# Patient Record
Sex: Female | Born: 1965 | Race: White | Hispanic: No | Marital: Married | Smoking: Never smoker
Health system: Southern US, Community
[De-identification: ages and names within clinical notes are randomized; demographics above are authoritative.]

## PROBLEM LIST (undated history)

## (undated) DIAGNOSIS — I491 Atrial premature depolarization: Secondary | ICD-10-CM

## (undated) DIAGNOSIS — R002 Palpitations: Secondary | ICD-10-CM

## (undated) DIAGNOSIS — I493 Ventricular premature depolarization: Secondary | ICD-10-CM

## (undated) DIAGNOSIS — C4490 Unspecified malignant neoplasm of skin, unspecified: Secondary | ICD-10-CM

## (undated) HISTORY — PX: KNEE SURGERY: SHX244

## (undated) HISTORY — DX: Unspecified malignant neoplasm of skin, unspecified: C44.90

## (undated) HISTORY — DX: Palpitations: R00.2

## (undated) HISTORY — DX: Ventricular premature depolarization: I49.3

## (undated) HISTORY — PX: SKIN CANCER EXCISION: SHX779

## (undated) HISTORY — DX: Atrial premature depolarization: I49.1

---

## 2002-08-01 ENCOUNTER — Ambulatory Visit (HOSPITAL_BASED_OUTPATIENT_CLINIC_OR_DEPARTMENT_OTHER): Admission: RE | Admit: 2002-08-01 | Discharge: 2002-08-01 | Payer: Self-pay | Admitting: Plastic Surgery

## 2002-08-01 ENCOUNTER — Encounter (INDEPENDENT_AMBULATORY_CARE_PROVIDER_SITE_OTHER): Payer: Self-pay | Admitting: Specialist

## 2005-08-31 ENCOUNTER — Encounter: Admission: RE | Admit: 2005-08-31 | Discharge: 2005-08-31 | Payer: Self-pay | Admitting: Family Medicine

## 2005-10-18 ENCOUNTER — Ambulatory Visit: Payer: Self-pay

## 2010-01-05 ENCOUNTER — Encounter: Admission: RE | Admit: 2010-01-05 | Discharge: 2010-01-05 | Payer: Self-pay | Admitting: Obstetrics and Gynecology

## 2010-09-17 NOTE — Op Note (Signed)
NAME:  Kathryn Day, Kathryn Day                         ACCOUNT NO.:  [id number]   MEDICAL RECORD NO.:  [id number]                   PATIENT TYPE:  AMB   LOCATION:  DSC                                  FACILITY:  [facility]   PHYSICIAN:  Alfredia Ferguson, M.D.               DATE OF BIRTH:  [birth year]   DATE OF PROCEDURE:  08/01/2002  DATE OF DISCHARGE:                                 OPERATIVE REPORT   PREOPERATIVE DIAGNOSIS:  A 7 mm biopsy proven basal cell carcinoma, right  upper forehead.   POSTOPERATIVE DIAGNOSIS:  A 7 mm biopsy proven basal cell carcinoma, right  upper forehead.   OPERATION PERFORMED:  Elliptical excision of basal cell carcinoma with 2 mm  margins.   SURGEON:  Alfredia Ferguson, M.D.   ANESTHESIA:  2% Xylocaine 1:100,000 epinephrine.   INDICATIONS FOR PROCEDURE:  The patient is a 45 year old woman with a  previous history of basal cell carcinomas.  She has a new lesion right at  the hair line on her forehead.  She wishes to have it removed.  She  understands that she will be trading what she has for a permanent and  potentially unsightly scar.  She understands the risks of positive margins  which would necessitate secondary surgery.  In spite of these risks, the  patient wishes to proceed with the surgery.   DESCRIPTION OF PROCEDURE:  A small amount of hair was removed near the basal  cell carcinoma.  An elliptical skin marker was placed with approximately 2  to 3 mm margins around the lesion.  Local anesthesia using 2% Xylocaine  1:100,000 epinephrine was infiltrated.  The forehead was prepped with  Betadine and draped with sterile drapes.  Approximately 10 minutes was  allowed to elapse before proceeding with surgery.  An elliptical excision of  the lesion down to the level of the subcutaneous tissue was carried out.  Specimen was passed off for pathology.  Wound edges were undermined for a  distance of several millimeters in all directions.  Hemostasis was  accomplished using pressure.  The wound was closed using interrupted 5-0  Monocryl for the dermis.  The skin was united using a running 5-0 nylon  suture.  The patient tolerated the procedure well with minimal blood loss.  A light dressing was applied and the patient was discharged to home in  satisfactory condition.                                                Alfredia Ferguson, M.D.    WBB/MEDQ  D:  08/01/2002  T:  08/01/2002  Job:  [id number]   cc:   Hope M. Danella Deis, M.D.  [address]  [place]  [place] [address]  Fax: [phone]

## 2011-02-24 ENCOUNTER — Other Ambulatory Visit: Payer: Self-pay | Admitting: Obstetrics and Gynecology

## 2011-02-24 DIAGNOSIS — Z1231 Encounter for screening mammogram for malignant neoplasm of breast: Secondary | ICD-10-CM

## 2011-03-16 ENCOUNTER — Ambulatory Visit
Admission: RE | Admit: 2011-03-16 | Discharge: 2011-03-16 | Disposition: A | Discharge status: Home / Self Care | Payer: BC Managed Care – PPO | Source: Ambulatory Visit | Attending: Obstetrics and Gynecology | Admitting: Obstetrics and Gynecology

## 2011-03-16 DIAGNOSIS — Z1231 Encounter for screening mammogram for malignant neoplasm of breast: Secondary | ICD-10-CM

## 2012-03-12 ENCOUNTER — Other Ambulatory Visit: Payer: Self-pay | Admitting: Obstetrics and Gynecology

## 2012-03-12 DIAGNOSIS — Z1231 Encounter for screening mammogram for malignant neoplasm of breast: Secondary | ICD-10-CM

## 2012-04-19 ENCOUNTER — Ambulatory Visit
Admission: RE | Admit: 2012-04-19 | Discharge: 2012-04-19 | Disposition: A | Discharge status: Home / Self Care | Payer: BC Managed Care – PPO | Source: Ambulatory Visit | Attending: Obstetrics and Gynecology | Admitting: Obstetrics and Gynecology

## 2012-04-19 DIAGNOSIS — Z1231 Encounter for screening mammogram for malignant neoplasm of breast: Secondary | ICD-10-CM

## 2013-03-25 ENCOUNTER — Other Ambulatory Visit: Payer: Self-pay

## 2013-03-25 DIAGNOSIS — Z1231 Encounter for screening mammogram for malignant neoplasm of breast: Secondary | ICD-10-CM

## 2013-04-02 ENCOUNTER — Other Ambulatory Visit: Payer: Self-pay | Admitting: Nurse Practitioner

## 2013-04-02 DIAGNOSIS — Z780 Asymptomatic menopausal state: Secondary | ICD-10-CM

## 2013-04-26 ENCOUNTER — Ambulatory Visit
Admission: RE | Admit: 2013-04-26 | Discharge: 2013-04-26 | Disposition: A | Discharge status: Home / Self Care | Payer: BC Managed Care – PPO | Source: Ambulatory Visit

## 2013-04-26 DIAGNOSIS — Z1231 Encounter for screening mammogram for malignant neoplasm of breast: Secondary | ICD-10-CM

## 2013-05-03 ENCOUNTER — Ambulatory Visit
Admission: RE | Admit: 2013-05-03 | Discharge: 2013-05-03 | Disposition: A | Discharge status: Home / Self Care | Payer: BC Managed Care – PPO | Source: Ambulatory Visit | Attending: Nurse Practitioner | Admitting: Nurse Practitioner

## 2013-05-03 DIAGNOSIS — Z780 Asymptomatic menopausal state: Secondary | ICD-10-CM

## 2014-04-15 ENCOUNTER — Other Ambulatory Visit: Payer: Self-pay

## 2014-04-15 DIAGNOSIS — Z1231 Encounter for screening mammogram for malignant neoplasm of breast: Secondary | ICD-10-CM

## 2014-05-01 ENCOUNTER — Ambulatory Visit
Admission: RE | Admit: 2014-05-01 | Discharge: 2014-05-01 | Disposition: A | Discharge status: Home / Self Care | Payer: BC Managed Care – PPO | Source: Ambulatory Visit

## 2014-05-01 DIAGNOSIS — Z1231 Encounter for screening mammogram for malignant neoplasm of breast: Secondary | ICD-10-CM

## 2015-04-10 ENCOUNTER — Other Ambulatory Visit: Payer: Self-pay

## 2015-04-10 DIAGNOSIS — Z1231 Encounter for screening mammogram for malignant neoplasm of breast: Secondary | ICD-10-CM

## 2015-04-23 ENCOUNTER — Ambulatory Visit
Admission: RE | Admit: 2015-04-23 | Discharge: 2015-04-23 | Disposition: A | Discharge status: Home / Self Care | Payer: BLUE CROSS/BLUE SHIELD | Source: Ambulatory Visit

## 2015-04-23 DIAGNOSIS — Z1231 Encounter for screening mammogram for malignant neoplasm of breast: Secondary | ICD-10-CM

## 2016-02-16 ENCOUNTER — Emergency Department (HOSPITAL_COMMUNITY): Payer: BLUE CROSS/BLUE SHIELD

## 2016-02-16 ENCOUNTER — Emergency Department (HOSPITAL_COMMUNITY)
Admission: EM | Admit: 2016-02-16 | Discharge: 2016-02-16 | Disposition: A | Discharge status: Home / Self Care | Payer: BLUE CROSS/BLUE SHIELD | Attending: Emergency Medicine | Admitting: Emergency Medicine

## 2016-02-16 DIAGNOSIS — S91311A Laceration without foreign body, right foot, initial encounter: Secondary | ICD-10-CM | POA: Diagnosis not present

## 2016-02-16 DIAGNOSIS — Y929 Unspecified place or not applicable: Secondary | ICD-10-CM | POA: Insufficient documentation

## 2016-02-16 DIAGNOSIS — Y939 Activity, unspecified: Secondary | ICD-10-CM | POA: Insufficient documentation

## 2016-02-16 DIAGNOSIS — W268XXA Contact with other sharp object(s), not elsewhere classified, initial encounter: Secondary | ICD-10-CM | POA: Diagnosis not present

## 2016-02-16 DIAGNOSIS — Y999 Unspecified external cause status: Secondary | ICD-10-CM | POA: Diagnosis not present

## 2016-02-16 MED ORDER — LIDOCAINE HCL (PF) 1 % IJ SOLN
10.0000 mL | Freq: Once | INTRAMUSCULAR | Status: AC
  Administered 2016-02-16: 10 mL
  Filled 2016-02-16: qty 10

## 2016-02-16 NOTE — Discharge Instructions (Signed)
Suture removal in 7-10 days 

## 2016-02-16 NOTE — ED Notes (Signed)
Patient Alert and oriented X4. Stable and ambulatory. Patient verbalized understanding of the discharge instructions.  Patient belongings were taken by the patient.  

## 2016-02-16 NOTE — ED Triage Notes (Signed)
Patient here for right foot laceration after dropping glass on same today, bleeding controlled with pressure dressing, also complains of headache

## 2016-02-16 NOTE — ED Provider Notes (Signed)
[facility] DEPT Provider Note   CSN: [id number] Arrival date & time: 02/16/16  1759     History   Chief Complaint No chief complaint on file.   HPI Kathryn Day is a 50 y.o. female.  Patient reports a dropping a glass container which shattered, and a glass shard struck the patient on the top of her left foot.  Bleeding controlled with direct pressure.   The history is provided by the patient. No language interpreter was used.  Laceration   The incident occurred 3 to 5 hours ago. The laceration is located on the right foot. The laceration is 4 cm in size. The laceration mechanism was a broken glass. The pain is mild. Her tetanus status is unknown (Patient will check with PCP).    No past medical history on file.  There are no active problems to display for this patient.   No past surgical history on file.  OB History    No data available       Home Medications    Prior to Admission medications   Not on File    Family History No family history on file.  Social History Social History  Substance Use Topics  . Smoking status: Not on file  . Smokeless tobacco: Not on file  . Alcohol use Not on file     Allergies   Review of patient's allergies indicates no known allergies.   Review of Systems Review of Systems  Skin: Positive for wound.  All other systems reviewed and are negative.    Physical Exam Updated Vital Signs BP 134/88 (BP Location: Left Arm)   Pulse 78   Temp 97.8 F (36.6 C) (Oral)   Resp 16   SpO2 100%   Physical Exam  Constitutional: She is oriented to person, place, and time. She appears well-developed and well-nourished.  HENT:  Head: Normocephalic.  Eyes: Conjunctivae are normal.  Neck: Neck supple.  Cardiovascular: Normal rate and regular rhythm.   Pulmonary/Chest: Effort normal and breath sounds normal.  Abdominal: Soft. Bowel sounds are normal.  Musculoskeletal: She exhibits tenderness. She exhibits no edema.      Feet:  Neurological: She is alert and oriented to person, place, and time.  Skin: Skin is warm and dry.  Psychiatric: She has a normal mood and affect.  Nursing note and vitals reviewed.    ED Treatments / Results  Labs (all labs ordered are listed, but only abnormal results are displayed) Labs Reviewed - No data to display  EKG  EKG Interpretation None       Radiology Dg Foot Complete Right  Result Date: 02/16/2016 CLINICAL DATA:  Dropped glass jar on right foot, with laceration at the dorsal foot about the heads of the second and third metatarsals. Initial encounter. EXAM: RIGHT FOOT COMPLETE - 3+ VIEW COMPARISON:  None. FINDINGS: There is no evidence of fracture or dislocation. The joint spaces are preserved. There is no evidence of talar subluxation; the subtalar joint is unremarkable in appearance. The known soft tissue laceration is not well characterized on radiograph. No radiopaque foreign bodies are seen. IMPRESSION: No evidence of fracture or dislocation. No radiopaque foreign bodies seen. Electronically Signed   By: Garald Balding M.D.   On: 02/16/2016 19:04    Procedures Procedures (including critical care time) LACERATION REPAIR Performed by: Norman Herrlich Authorized by: Norman Herrlich Consent: Verbal consent obtained. Risks and benefits: risks, benefits and alternatives were discussed Consent given by: patient Patient identity confirmed: provided demographic  data Prepped and Draped in normal sterile fashion Wound explored  Laceration Location: dorsum right foot  Laceration Length: 4 cm  No Foreign Bodies seen or palpated  Anesthesia: local infiltration  Local anesthetic: lidocaine 1%   Anesthetic total: 5 ml  Irrigation method: syringe  Amount of cleaning: standard  Skin closure: 4.0 prolene  Number of sutures: 7  Technique: simple interrupted  Patient tolerance: Patient tolerated the procedure well with no immediate  complications.   Medications Ordered in ED Medications - No data to display   Initial Impression / Assessment and Plan / ED Course  I have reviewed the triage vital signs and the nursing notes.  Pertinent labs & imaging results that were available during my care of the patient were reviewed by me and considered in my medical decision making (see chart for details).  Clinical Course  Tetanus  UTD. Laceration occurred < 12 hours prior to repair. Discussed laceration care with pt and answered questions. Pt to f-u for suture removal in 7-10 days and wound check sooner should there be signs of dehiscence or infection. Pt is hemodynamically stable with no complaints prior to dc.       Final Clinical Impressions(s) / ED Diagnoses   Final diagnoses:  Foot laceration, right, initial encounter    New Prescriptions New Prescriptions   No medications on file     Etta Quill, NP 02/16/16 2320    Duffy Bruce, MD 02/18/16 [phone]

## 2016-03-11 ENCOUNTER — Other Ambulatory Visit: Payer: Self-pay | Admitting: Nurse Practitioner

## 2016-03-11 DIAGNOSIS — Z1231 Encounter for screening mammogram for malignant neoplasm of breast: Secondary | ICD-10-CM

## 2016-04-28 ENCOUNTER — Ambulatory Visit: Payer: BLUE CROSS/BLUE SHIELD

## 2016-07-12 ENCOUNTER — Encounter: Payer: Self-pay | Admitting: Primary Care

## 2016-07-27 ENCOUNTER — Ambulatory Visit
Admission: RE | Admit: 2016-07-27 | Discharge: 2016-07-27 | Disposition: A | Discharge status: Home / Self Care | Payer: BC Managed Care – PPO | Source: Ambulatory Visit | Attending: Nurse Practitioner | Admitting: Nurse Practitioner

## 2016-07-27 DIAGNOSIS — Z1231 Encounter for screening mammogram for malignant neoplasm of breast: Secondary | ICD-10-CM

## 2016-08-22 ENCOUNTER — Encounter: Payer: Self-pay | Admitting: Cardiovascular Disease

## 2016-08-22 ENCOUNTER — Ambulatory Visit (INDEPENDENT_AMBULATORY_CARE_PROVIDER_SITE_OTHER): Payer: BC Managed Care – PPO | Admitting: Cardiovascular Disease

## 2016-08-22 VITALS — BP 126/72 | HR 68 | Ht 65.0 in | Wt 122.0 lb

## 2016-08-22 DIAGNOSIS — R002 Palpitations: Secondary | ICD-10-CM | POA: Diagnosis not present

## 2016-08-22 DIAGNOSIS — R0602 Shortness of breath: Secondary | ICD-10-CM

## 2016-08-22 NOTE — Progress Notes (Signed)
Cardiology Office Note   Date:  08/22/2016   ID:  CLEOTA PELLERITO, DOB 1965-09-23, MRN [id number]  PCP:  No primary care provider on file.  Cardiologist:   Skeet Latch, MD   Chief Complaint  Patient presents with  . New Patient (Initial Visit)    HEART PALPITATIONS/FLUTTER       History of Present Illness: RAYN SHORB is a 51 y.o. female with basal cell carcinoma who presents for an evaluation of palpitations.  Ms. Yarbough saw Dr. Antonietta Jewel 07/12/16 and reported two months of palpitations.  EKG at that time showed sinus rhythm rate 72 bpm with right bundle branch block.  She was referred to cardiology for consultation. She started noticing palpitations last fall. She thinks this might be correlated with increased stress from family and friends over the last year. She has episodes of palpitations that occur approximately once per week. It lasts for a few seconds at a time and typically occurs twice per day when she has them. However last week she had an episode of palpitations that lasted almost the whole day. She was not feeling anxious at the time but felt like she had to cry. 2 days later she had a recurrent episode of palpitations that lasted for 7 hours. There is no associated chest pain, shortness of breath, lightheadedness, or dizziness.  Ms. Mcgurk exercises regularly and has no chest pain or shortness of breath with this activity. She notes some swelling in her legs at the end of the day. However, she uses a standing desk all day.   Ms. Stratmann does not drink any caffeine. She rarely uses over-the-counter cold and cough medication and does not drink any alcohol. She has a very healthy diet and his vegetarian.    Past Medical History:  Diagnosis Date  . Palpitations   . Skin cancer    BASAL AND SQUAMOUS CELL    No past surgical history on file.   Current Outpatient Prescriptions  Medication Sig Dispense Refill  . Calcium-Magnesium-Vitamin D (CALCIUM MAGNESIUM  PO) Take by mouth daily.     No current facility-administered medications for this visit.     Allergies:   Patient has no known allergies.    Social History:  The patient  reports that she has never smoked. She has never used smokeless tobacco. She reports that she does not drink alcohol or use drugs.   Family History:  The patient's family history includes Atrial fibrillation in her cousin and maternal aunt; Cancer in her paternal grandfather; Heart attack in her maternal grandfather; Heart disease in her maternal grandfather; Hypertension in her brother; Melanoma in her maternal grandmother and paternal grandmother; Ovarian cancer in her maternal aunt; Stroke in her maternal grandmother; Throat cancer in her paternal grandfather.    ROS:  Please see the history of present illness.   Otherwise, review of systems are positive for none.   All other systems are reviewed and negative.    PHYSICAL EXAM: VS:  BP 126/72   Pulse 68   Ht 5\' 5"  (1.651 m)   Wt 55.3 kg (122 lb)   BMI 20.30 kg/m  , BMI Body mass index is 20.3 kg/m. GENERAL:  Well appearing HEENT:  Pupils equal round and reactive, fundi not visualized, oral mucosa unremarkable NECK:  No jugular venous distention, waveform within normal limits, carotid upstroke brisk and symmetric, no bruits, no thyromegaly LYMPHATICS:  No cervical adenopathy LUNGS:  Clear to auscultation bilaterally HEART:  RRR.  PMI not displaced or sustained,S1 and S2 within normal limits, no S3, no S4, no clicks, no rubs, no murmurs ABD:  Flat, positive bowel sounds normal in frequency in pitch, no bruits, no rebound, no guarding, no midline pulsatile mass, no hepatomegaly, no splenomegaly EXT:  2 plus pulses throughout, no edema, no cyanosis no clubbing SKIN:  No rashes no nodules NEURO:  Cranial nerves II through XII grossly intact, motor grossly intact throughout PSYCH:  Cognitively intact, oriented to person place and time    EKG:  EKG is ordered  today. The ekg ordered 07/12/16 demonstrates sinus rhythm. Rate 72 bpm. Right bundle branch block. Right ventricular hypertrophy   Recent Labs: No results found for requested labs within last 8760 hours.   07/13/16:  Total cholesterol 167, triglycerides 85, HDL 82, LDL 68 Sodium 140, potassium 4.3, BUN 13, creatinine 0.86 Hemoglobin A1c 5.2% TSH 1.6 WBC 8.4, hemoglobin 38.0, hematocrit 13.0, platelets 239  Lipid Panel No results found for: CHOL, TRIG, HDL, CHOLHDL, VLDL, LDLCALC, LDLDIRECT    Wt Readings from Last 3 Encounters:  08/22/16 55.3 kg (122 lb)      ASSESSMENT AND PLAN:  # Palpitations: She has palpitations that occur once per week that sound like PACs or PVCs. However she is also had some episodes of sustained palpitations. We will get a 30 day event monitor to better evaluate. Laboratory work has been unremarkable.  # Abnormal EKG: EKG shows evidence of right bundle-branch block and possible right ventricular hypertrophy. She does have lower extremity edema. We will obtain an echo to better evaluate.  Current medicines are reviewed at length with the patient today.  The patient does not have concerns regarding medicines.  The following changes have been made:  no change  Labs/ tests ordered today include:   Orders Placed This Encounter  Procedures  . Cardiac event monitor  . ECHOCARDIOGRAM COMPLETE     Disposition:   FU with Saydee Zolman C. Oval Linsey, MD, [facility] in 6-8 weeks.   This note was written with the assistance of speech recognition software.  Please excuse any transcriptional errors.  Signed, Hazley Dezeeuw C. Oval Linsey, MD, [facility]  08/22/2016 6:08 PM    [facility]

## 2016-08-22 NOTE — Patient Instructions (Addendum)
Medication Instructions:  Your physician recommends that you continue on your current medications as directed. Please refer to the Current Medication list given to you today.  Labwork: none  Testing/Procedures: Your physician has requested that you have an echocardiogram. Echocardiography is a painless test that uses sound waves to create images of your heart. It provides your doctor with information about the size and shape of your heart and how well your heart's chambers and valves are working. This procedure takes approximately one hour. There are no restrictions for this procedure.  Your physician has recommended that you wear an event monitor. Event monitors are medical devices that record the heart's electrical activity. Doctors most often [place] these monitors to diagnose arrhythmias. Arrhythmias are problems with the speed or rhythm of the heartbeat. The monitor is a small, portable device. You can wear one while you do your normal daily activities. This is usually used to diagnose what is causing palpitations/syncope (passing out). 30 day   ABOVE TO BE DONE AT [place] [address]   Follow-Up: Your physician recommends that you schedule a follow-up appointment in: 6-8 weeks  If you need a refill on your cardiac medications before your next appointment, please call your pharmacy.

## 2016-08-29 ENCOUNTER — Telehealth: Payer: Self-pay | Admitting: Cardiovascular Disease

## 2016-08-29 NOTE — Telephone Encounter (Signed)
Patient calling, states that she had an abnormal EKG last night and had palpitations. Kathryn Day would like to discuss her results, thank you!

## 2016-08-29 NOTE — Telephone Encounter (Signed)
Spoke with patient and she saw PCP today and he reviewed EKG's. He did give Rx for Xanax as needed She will get monitor tomorrow as scheduled

## 2016-08-29 NOTE — Telephone Encounter (Signed)
Spoke with patient and her husband who is a teaching EMT regarding patients heart rhythm. Patient having a lot of palpitations so he did a rhythm strip and can not tell what rhythm is but it is abnormal. She did call her PCP and was told to come in at 10:00. Advised to take readings with them to PCP appointment and have physician review as well as fax to Dr Oval Linsey for review. Advised to keep appointment for event monitor tomorrow. Agreeable to plan

## 2016-08-30 ENCOUNTER — Ambulatory Visit (INDEPENDENT_AMBULATORY_CARE_PROVIDER_SITE_OTHER): Payer: BC Managed Care – PPO

## 2016-08-30 DIAGNOSIS — R0602 Shortness of breath: Secondary | ICD-10-CM

## 2016-08-30 DIAGNOSIS — R002 Palpitations: Secondary | ICD-10-CM | POA: Diagnosis not present

## 2016-09-02 ENCOUNTER — Ambulatory Visit (HOSPITAL_COMMUNITY): Payer: BC Managed Care – PPO | Attending: Cardiology

## 2016-09-02 ENCOUNTER — Other Ambulatory Visit: Payer: Self-pay

## 2016-09-02 DIAGNOSIS — R0602 Shortness of breath: Secondary | ICD-10-CM | POA: Insufficient documentation

## 2016-09-02 DIAGNOSIS — I071 Rheumatic tricuspid insufficiency: Secondary | ICD-10-CM | POA: Diagnosis not present

## 2016-09-02 DIAGNOSIS — R002 Palpitations: Secondary | ICD-10-CM | POA: Insufficient documentation

## 2016-09-02 LAB — ECHOCARDIOGRAM COMPLETE
CHL CUP MV DEC (S): 254
CHL CUP STROKE VOLUME: 32 mL
E decel time: 254 msec
EERAT: 0.74
FS: 39 % (ref 28–44)
IVS/LV PW RATIO, ED: 1
LAVOL: 27 mL
LAVOLA4C: 25 mL
LAVOLIN: 16.9 mL/m2
LDCA: 2.27 cm2
LV E/e' medial: 0.74
LV E/e'average: 0.74
LV SIMPSON'S DISK: 63
LV dias vol: 51 mL (ref 46–106)
LV e' LATERAL: 14.9 cm/s
LV sys vol index: 12 mL/m2
LV sys vol: 19 mL (ref 14–42)
LVDIAVOLIN: 32 mL/m2
LVOT VTI: 22.1 cm
LVOT diameter: 17 mm
LVOT peak grad rest: 4 mmHg
LVOTPV: 105 cm/s
LVOTSV: 50 mL
Lateral S' vel: 15.8 cm/s
MV pk E vel: 11.1 m/s
MVPKAVEL: 65.6 m/s
PW: 7.78 mm — AB (ref 0.6–1.1)
RV sys press: 28 mmHg
Reg peak vel: 251 cm/s
TDI e' lateral: 14.9
TDI e' medial: 13.1
TR max vel: 251 cm/s

## 2016-10-18 ENCOUNTER — Encounter: Payer: Self-pay | Admitting: Cardiovascular Disease

## 2016-10-18 ENCOUNTER — Ambulatory Visit (INDEPENDENT_AMBULATORY_CARE_PROVIDER_SITE_OTHER): Payer: BC Managed Care – PPO | Admitting: Cardiovascular Disease

## 2016-10-18 VITALS — BP 110/70 | HR 70 | Ht 65.0 in | Wt 122.2 lb

## 2016-10-18 DIAGNOSIS — I491 Atrial premature depolarization: Secondary | ICD-10-CM

## 2016-10-18 DIAGNOSIS — R002 Palpitations: Secondary | ICD-10-CM | POA: Diagnosis not present

## 2016-10-18 DIAGNOSIS — I493 Ventricular premature depolarization: Secondary | ICD-10-CM | POA: Diagnosis not present

## 2016-10-18 HISTORY — DX: Ventricular premature depolarization: I49.3

## 2016-10-18 HISTORY — DX: Atrial premature depolarization: I49.1

## 2016-10-18 LAB — BASIC METABOLIC PANEL
BUN/Creatinine Ratio: 13 (ref 9–23)
BUN: 9 mg/dL (ref 6–24)
CALCIUM: 9.7 mg/dL (ref 8.7–10.2)
CHLORIDE: 100 mmol/L (ref 96–106)
CO2: 24 mmol/L (ref 20–29)
Creatinine, Ser: 0.68 mg/dL (ref 0.57–1.00)
GFR calc Af Amer: 119 mL/min/{1.73_m2} (ref >59)
GFR calc non Af Amer: 103 mL/min/{1.73_m2} (ref >59)
GLUCOSE: 76 mg/dL (ref 65–99)
Potassium: 4.6 mmol/L (ref 3.5–5.2)
Sodium: 140 mmol/L (ref 134–144)

## 2016-10-18 LAB — MAGNESIUM: MAGNESIUM: 1.9 mg/dL (ref 1.6–2.3)

## 2016-10-18 MED ORDER — METOPROLOL TARTRATE 25 MG PO TABS: ORAL_TABLET | ORAL | 5 refills | Status: DC

## 2016-10-18 NOTE — Patient Instructions (Addendum)
Medication Instructions:  START METOPROLOL TART 12.5 MG ONE TABLET EVERY 4 HOURS AS NEEDED FOR PALPITATIONS   Labwork: BMET/MAGNESIUM TODAY   Testing/Procedures: NONE  Follow-Up: AS NEEDED

## 2016-10-18 NOTE — Progress Notes (Signed)
Cardiology Office Note   Date:  10/18/2016   ID:  [name], DOB Dec 15, 1965, MRN [id number]  PCP:  Antonietta Jewel, MD  Cardiologist:   Skeet Latch, MD   Chief Complaint  Patient presents with  . Follow-up     History of Present Illness: Kathryn Day is a 51 y.o. female with basal cell carcinoma who presents for follow up.  She was initially seen 07/2016 for an evaluation of palpitations.  She wore a 30 day event monitor 08/30/16 that revealed occasional PACs and PVCs but no arrhythmias. She also had an echocardiogram 09/02/16 that revealed LVEF 60-65% with normal diastolic function.  She has been doing fairly well.  She notes palpitations intermittently and that they were worse last night.  Her palpitations last for a few seconds.  They are worse when she has indigestion and are not associated with exertion.  She notes that she has been under a lot of stress lately.  [name] is a vegetarian and eats a lot of foods rich in magnesium.  She wonders if this contributes to her palpitations.  She denies chest pain, shortness of breath, lower extremity edema, orthopnea or PND.   Past Medical History:  Diagnosis Date  . PAC (premature atrial contraction) 10/18/2016  . Palpitations   . PVC (premature ventricular contraction) 10/18/2016  . Skin cancer    BASAL AND SQUAMOUS CELL    No past surgical history on file.   Current Outpatient Prescriptions  Medication Sig Dispense Refill  . metoprolol tartrate (LOPRESSOR) 25 MG tablet Take 1/2 tablet by mouth every 4 hours as needed for palpitations 15 tablet 5   No current facility-administered medications for this visit.     Allergies:   Patient has no known allergies.    Social History:  The patient  reports that she has never smoked. She has never used smokeless tobacco. She reports that she does not drink alcohol or use drugs.   Family History:  The patient's family history includes Atrial fibrillation in her cousin and  maternal aunt; Cancer in her paternal grandfather; Heart attack in her maternal grandfather; Heart disease in her maternal grandfather; Hypertension in her brother; Melanoma in her maternal grandmother and paternal grandmother; Ovarian cancer in her maternal aunt; Stroke in her maternal grandmother; Throat cancer in her paternal grandfather.    ROS:  Please see the history of present illness.   Otherwise, review of systems are positive for none.   All other systems are reviewed and negative.    PHYSICAL EXAM: VS:  BP 110/70   Pulse 70   Ht 5\' 5"  (1.651 m)   Wt 55.4 kg (122 lb 3.2 oz)   BMI 20.34 kg/m  , BMI Body mass index is 20.34 kg/m. GENERAL:  Well appearing.  No acute distress. HEENT:  Pupils equal round and reactive, fundi not visualized, oral mucosa unremarkable NECK:  No jugular venous distention, waveform within normal limits, carotid upstroke brisk and symmetric, no bruits LUNGS:  Clear to auscultation bilaterally HEART:  RRR.  PMI not displaced or sustained,S1 and S2 within normal limits, no S3, no S4, no clicks, no rubs, no murmurs ABD:  Flat, positive bowel sounds normal in frequency in pitch, no bruits, no rebound, no guarding, no midline pulsatile mass, no hepatomegaly, no splenomegaly EXT:  2 plus pulses throughout, no edema, no cyanosis no clubbing SKIN:  No rashes no nodules NEURO:  Cranial nerves II through XII grossly intact, motor grossly intact throughout  PSYCH:  Cognitively intact, oriented to person place and time   EKG:  EKG is not ordered today. The ekg ordered 07/12/16 demonstrates sinus rhythm. Rate 72 bpm. Right bundle branch block. Right ventricular hypertrophy  30 Day Event Monitor 08/30/16:  Quality: Fair.  Baseline artifact. Predominant rhythm: sinus rhythm, sinus bradycardia Average heart rate: 74 bpm Max heart rate: 157 bpm Min heart rate: 46 bpm Pauses >2.5 seconds: 0  PACs and PVCs noted  Echo 09/02/16: Study Conclusions  - Left ventricle:  The cavity size was normal. Systolic function was   normal. The estimated ejection fraction was in the range of 60%   to 65%. Wall motion was normal; there were no regional wall   motion abnormalities. Left ventricular diastolic function   parameters were normal. - Aortic valve: Trileaflet; normal thickness leaflets. There was no   regurgitation. - Aortic root: The aortic root was normal in size. - Mitral valve: There was trivial regurgitation. - Right ventricle: The cavity size was normal. Wall thickness was   normal. Systolic function was normal. - Tricuspid valve: There was mild regurgitation. - Pulmonary arteries: Systolic pressure was within the normal   range. - Inferior vena cava: The vessel was normal in size. - Pericardium, extracardiac: There was no pericardial effusion.   Recent Labs: No results found for requested labs within last 8760 hours.   07/13/16:  Total cholesterol 167, triglycerides 85, HDL 82, LDL 68 Sodium 140, potassium 4.3, BUN 13, creatinine 0.86 Hemoglobin A1c 5.2% TSH 1.6 WBC 8.4, hemoglobin 38.0, hematocrit 13.0, platelets 239  Lipid Panel No results found for: CHOL, TRIG, HDL, CHOLHDL, VLDL, LDLCALC, LDLDIRECT    Wt Readings from Last 3 Encounters:  10/18/16 55.4 kg (122 lb 3.2 oz)  08/22/16 55.3 kg (122 lb)      ASSESSMENT AND PLAN:  # Palpitations:  # PACs/PVCs: Symptoms are likely triggered by anxiety.  She will start back exercising and talk with her PCP.  Continue alprazolam as needed.  We will add metoprolol tartrate 12.5mg  q4h as needed  # Abnormal EKG: EKG shows evidence of right bundle-branch block and possible right ventricular hypertrophy. She does have lower extremity edema. Echo was unremarkable.  Current medicines are reviewed at length with the patient today.  The patient does not have concerns regarding medicines.  The following changes have been made:  no change  Labs/ tests ordered today include:   Orders Placed This  Encounter  Procedures  . Magnesium  . Basic metabolic panel     Disposition:   FU with Audrionna Lampton C. Oval Linsey, MD, [facility] as needed.   This note was written with the assistance of speech recognition software.  Please excuse any transcriptional errors.  Signed, Cleda Imel C. Oval Linsey, MD, [facility]  10/18/2016 9:42 AM    [facility]

## 2017-06-30 ENCOUNTER — Other Ambulatory Visit: Payer: Self-pay | Admitting: Primary Care

## 2017-06-30 DIAGNOSIS — Z139 Encounter for screening, unspecified: Secondary | ICD-10-CM

## 2017-07-26 ENCOUNTER — Encounter: Payer: Self-pay | Admitting: Primary Care

## 2017-07-26 ENCOUNTER — Other Ambulatory Visit (HOSPITAL_COMMUNITY)
Admission: RE | Admit: 2017-07-26 | Discharge: 2017-07-26 | Disposition: A | Discharge status: Home / Self Care | Payer: BC Managed Care – PPO | Source: Ambulatory Visit | Attending: Family Medicine | Admitting: Family Medicine

## 2017-07-26 ENCOUNTER — Ambulatory Visit: Payer: BC Managed Care – PPO | Admitting: Primary Care

## 2017-07-26 VITALS — BP 106/68 | HR 70 | Temp 97.8°F | Ht 65.0 in | Wt 121.8 lb

## 2017-07-26 DIAGNOSIS — Z0000 Encounter for general adult medical examination without abnormal findings: Secondary | ICD-10-CM | POA: Insufficient documentation

## 2017-07-26 DIAGNOSIS — R002 Palpitations: Secondary | ICD-10-CM

## 2017-07-26 DIAGNOSIS — Z124 Encounter for screening for malignant neoplasm of cervix: Secondary | ICD-10-CM | POA: Diagnosis not present

## 2017-07-26 DIAGNOSIS — Z1211 Encounter for screening for malignant neoplasm of colon: Secondary | ICD-10-CM

## 2017-07-26 LAB — LIPID PANEL
CHOL/HDL RATIO: 2
Cholesterol: 147 mg/dL (ref 0–200)
HDL: 70.4 mg/dL (ref >39.00)
LDL Cholesterol: 65 mg/dL (ref 0–99)
NONHDL: 77.03
Triglycerides: 61 mg/dL (ref 0.0–149.0)
VLDL: 12.2 mg/dL (ref 0.0–40.0)

## 2017-07-26 LAB — COMPREHENSIVE METABOLIC PANEL
ALT: 22 U/L (ref 0–35)
AST: 25 U/L (ref 0–37)
Albumin: 4.5 g/dL (ref 3.5–5.2)
Alkaline Phosphatase: 70 U/L (ref 39–117)
BUN: 10 mg/dL (ref 6–23)
CO2: 32 meq/L (ref 19–32)
CREATININE: 0.68 mg/dL (ref 0.40–1.20)
Calcium: 9.7 mg/dL (ref 8.4–10.5)
Chloride: 101 mEq/L (ref 96–112)
GFR: 97.21 mL/min (ref >60.00)
GLUCOSE: 98 mg/dL (ref 70–99)
Potassium: 4.3 mEq/L (ref 3.5–5.1)
Sodium: 140 mEq/L (ref 135–145)
Total Bilirubin: 0.7 mg/dL (ref 0.2–1.2)
Total Protein: 7.4 g/dL (ref 6.0–8.3)

## 2017-07-26 NOTE — Assessment & Plan Note (Signed)
Infrequent, never began metoprolol. Holter Monitor revealed PAC's and PVC's. No complaints recently.

## 2017-07-26 NOTE — Assessment & Plan Note (Signed)
Immunizations UTD. Pap smear due, completed today. Mammogram due, pending for Friday this week. Colonoscopy due, referral placed. Commended her on a healthy lifestyle. Exam unremarkable. Labs pending. Follow up in 1 year.

## 2017-07-26 NOTE — Patient Instructions (Signed)
Stop by the lab prior to leaving today. I will notify you of your results once received.   We will be in touch once we receive your pap results.   Continue exercising. You should be getting 150 minutes of moderate intensity exercise weekly.  Continue to work on your healthy diet.  Ensure you are consuming 64 ounces of water daily.  You will be contacted regarding your referral to GI for the colonoscopy.  Please let us know if you have not been contacted within one week.   It was a pleasure to meet you today! Please don't hesitate to call or message me with any questions. Welcome to [facility]!   Preventive Care 40-64 Years, Female Preventive care refers to lifestyle choices and visits with your health care provider that can promote health and wellness. What does preventive care include?  A yearly physical exam. This is also called an annual well check.  Dental exams once or twice a year.  Routine eye exams. Ask your health care provider how often you should have your eyes checked.  Personal lifestyle choices, including: ? Daily care of your teeth and gums. ? Regular physical activity. ? Eating a healthy diet. ? Avoiding tobacco and drug use. ? Limiting alcohol use. ? Practicing safe sex. ? Taking low-dose aspirin daily starting at age 79. ? Taking vitamin and mineral supplements as recommended by your health care provider. What happens during an annual well check? The services and screenings done by your health care provider during your annual well check will depend on your age, overall health, lifestyle risk factors, and family history of disease. Counseling Your health care provider may ask you questions about your:  Alcohol use.  Tobacco use.  Drug use.  Emotional well-being.  Home and relationship well-being.  Sexual activity.  Eating habits.  Work and work Statistician.  Method of birth control.  Menstrual cycle.  Pregnancy history.  Screening You may  have the following tests or measurements:  Height, weight, and BMI.  Blood pressure.  Lipid and cholesterol levels. These may be checked every 5 years, or more frequently if you are over 20 years old.  Skin check.  Lung cancer screening. You may have this screening every year starting at age 55 if you have a 30-pack-year history of smoking and currently smoke or have quit within the past 15 years.  Fecal occult blood test (FOBT) of the stool. You may have this test every year starting at age 20.  Flexible sigmoidoscopy or colonoscopy. You may have a sigmoidoscopy every 5 years or a colonoscopy every 10 years starting at age 60.  Hepatitis C blood test.  Hepatitis B blood test.  Sexually transmitted disease (STD) testing.  Diabetes screening. This is done by checking your blood sugar (glucose) after you have not eaten for a while (fasting). You may have this done every 1-3 years.  Mammogram. This may be done every 1-2 years. Talk to your health care provider about when you should start having regular mammograms. This may depend on whether you have a family history of breast cancer.  BRCA-related cancer screening. This may be done if you have a family history of breast, ovarian, tubal, or peritoneal cancers.  Pelvic exam and Pap test. This may be done every 3 years starting at age 7. Starting at age 41, this may be done every 5 years if you have a Pap test in combination with an HPV test.  Bone density scan. This is done to screen  for osteoporosis. You may have this scan if you are at high risk for osteoporosis.  Discuss your test results, treatment options, and if necessary, the need for more tests with your health care provider. Vaccines Your health care provider may recommend certain vaccines, such as:  Influenza vaccine. This is recommended every year.  Tetanus, diphtheria, and acellular pertussis (Tdap, Td) vaccine. You may need a Td booster every 10 years.  Varicella  vaccine. You may need this if you have not been vaccinated.  Zoster vaccine. You may need this after age 66.  Measles, mumps, and rubella (MMR) vaccine. You may need at least one dose of MMR if you were born in 1957 or later. You may also need a second dose.  Pneumococcal 13-valent conjugate (PCV13) vaccine. You may need this if you have certain conditions and were not previously vaccinated.  Pneumococcal polysaccharide (PPSV23) vaccine. You may need one or two doses if you smoke cigarettes or if you have certain conditions.  Meningococcal vaccine. You may need this if you have certain conditions.  Hepatitis A vaccine. You may need this if you have certain conditions or if you travel or work in places where you may be exposed to hepatitis A.  Hepatitis B vaccine. You may need this if you have certain conditions or if you travel or work in places where you may be exposed to hepatitis B.  Haemophilus influenzae type b (Hib) vaccine. You may need this if you have certain conditions.  Talk to your health care provider about which screenings and vaccines you need and how often you need them. This information is not intended to replace advice given to you by your health care provider. Make sure you discuss any questions you have with your health care provider. Document Released: 05/15/2015 Document Revised: 01/06/2016 Document Reviewed: 02/17/2015 Elsevier Interactive Patient Education  [employer].

## 2017-07-26 NOTE — Progress Notes (Signed)
Subjective:    Patient ID: Kathryn Day, female    DOB: 1965-08-25, 52 y.o.   MRN: [id number]  HPI  Kathryn Day is a 52 year old female who presents today to establish care and for complete physical.   1) Palpitations: Evaluated by cardiology in March and April 2018 for a 2-3 month history of palpitations. She had been under a lot of stress at that time, endorsed a healthy diet, and no caffeine consumption. She underwent ECG which showed NSR with rate of 72 and RBBB. She wore a 30 day Holter Monitor which revealed PAC and PVC's. She also underwent echocardiogram which showed LVEF of 60-65% with normal diastolic function.   Her symptoms were thought to be secondary to anxiety from stress. She was put on alprazolam PRN and metoprolol tartrate 12.5 mg every 4 hours as needed. Since her last cardiology visit symptoms have improved since she started exercising and working on stress reduction.   She does notice occasional palpitations but they are not as bothersome. She never started the metoprolol and does not take alprazolam.   Immunizations: -Tetanus: Completed in 2017 -Influenza: Completed this season    Diet: She endorses a healthy diet Breakfast: Whole wheat bread with almond butter, fruit Lunch: Avocado, cheese, fruit Dinner: Salad, black beans, pasta, restaurants  Snacks: None Desserts: Occasionally yogurt Beverages: Water  Exercise: She will occasionally lift weights, gets on rowing machine 1-2 times weekly.  Eye exam: Completed in 2018 Dental exam: Completes semi-annually Colonoscopy: Never completed, due. Pap Smear: Completed in 2016, due. Mammogram: Due this Friday   Review of Systems  Constitutional: Negative for unexpected weight change.  HENT: Negative for rhinorrhea.   Respiratory: Negative for cough and shortness of breath.   Cardiovascular: Negative for chest pain and palpitations.       Occasional "skipped beats".  Gastrointestinal: Negative for  constipation and diarrhea.  Genitourinary: Negative for difficulty urinating and menstrual problem.  Musculoskeletal: Negative for arthralgias and myalgias.  Skin: Negative for rash.  Allergic/Immunologic: Negative for environmental allergies.  Neurological: Negative for dizziness, numbness and headaches.  Psychiatric/Behavioral: The patient is not nervous/anxious.        Past Medical History:  Diagnosis Date  . PAC (premature atrial contraction) 10/18/2016  . Palpitations   . PVC (premature ventricular contraction) 10/18/2016  . Skin cancer    BASAL AND SQUAMOUS CELL     Social History   Socioeconomic History  . Marital status: Married    Spouse name: Not on file  . Number of children: Not on file  . Years of education: Not on file  . Highest education level: Not on file  Occupational History  . Not on file  Social Needs  . Financial resource strain: Not on file  . Food insecurity:    Worry: Not on file    Inability: Not on file  . Transportation needs:    Medical: Not on file    Non-medical: Not on file  Tobacco Use  . Smoking status: Never Smoker  . Smokeless tobacco: Never Used  Substance and Sexual Activity  . Alcohol use: No  . Drug use: No  . Sexual activity: Not on file  Lifestyle  . Physical activity:    Days per week: Not on file    Minutes per session: Not on file  . Stress: Not on file  Relationships  . Social connections:    Talks on phone: Not on file    Gets together: Not on file  Attends religious service: Not on file    Active member of club or organization: Not on file    Attends meetings of clubs or organizations: Not on file    Relationship status: Not on file  . Intimate partner violence:    Fear of current or ex partner: Not on file    Emotionally abused: Not on file    Physically abused: Not on file    Forced sexual activity: Not on file  Other Topics Concern  . Not on file  Social History Narrative   Married.   No children.    Works as an Optometrist.   Enjoys spending time with family and being outdoors.    Past Surgical History:  Procedure Laterality Date  . KNEE SURGERY Left    [id number],9604, 2001    Family History  Problem Relation Age of Onset  . Heart attack Maternal Grandfather   . Heart disease Maternal Grandfather   . Atrial fibrillation Maternal Aunt   . Atrial fibrillation Cousin   . Stroke Maternal Grandmother   . Melanoma Maternal Grandmother   . Melanoma Paternal Grandmother   . Throat cancer Paternal Grandfather   . Cancer Paternal Grandfather   . Ovarian cancer Maternal Aunt   . Hypertension Brother     No Known Allergies  No current outpatient medications on file prior to visit.   No current facility-administered medications on file prior to visit.     BP 106/68   Pulse 70   Temp 97.8 F (36.6 C) (Oral)   Ht 5\' 5"  (1.651 m)   Wt 121 lb 12 oz (55.2 kg)   SpO2 98%   BMI 20.26 kg/m    Objective:   Physical Exam  Constitutional: She is oriented to person, place, and time. She appears well-nourished.  HENT:  Right Ear: Tympanic membrane and ear canal normal.  Left Ear: Tympanic membrane and ear canal normal.  Nose: Nose normal.  Mouth/Throat: Oropharynx is clear and moist.  Eyes: Pupils are equal, round, and reactive to light. Conjunctivae and EOM are normal.  Neck: Neck supple. No thyromegaly present.  Cardiovascular: Normal rate and regular rhythm.  No murmur heard. Pulmonary/Chest: Effort normal and breath sounds normal. She has no rales.  Abdominal: Soft. Bowel sounds are normal. There is no tenderness.  Musculoskeletal: Normal range of motion.  Lymphadenopathy:    She has no cervical adenopathy.  Neurological: She is alert and oriented to person, place, and time. She has normal reflexes. No cranial nerve deficit.  Skin: Skin is warm and dry. No rash noted.  Psychiatric: She has a normal mood and affect.          Assessment & Plan:

## 2017-07-27 LAB — CYTOLOGY - PAP: DIAGNOSIS: NEGATIVE

## 2017-07-28 ENCOUNTER — Ambulatory Visit
Admission: RE | Admit: 2017-07-28 | Discharge: 2017-07-28 | Disposition: A | Discharge status: Home / Self Care | Payer: BC Managed Care – PPO | Source: Ambulatory Visit | Attending: Primary Care | Admitting: Primary Care

## 2017-07-28 ENCOUNTER — Encounter: Payer: Self-pay | Admitting: Gastroenterology

## 2017-07-28 DIAGNOSIS — Z139 Encounter for screening, unspecified: Secondary | ICD-10-CM

## 2017-09-07 ENCOUNTER — Ambulatory Visit (AMBULATORY_SURGERY_CENTER): Payer: Self-pay

## 2017-09-07 ENCOUNTER — Other Ambulatory Visit: Payer: Self-pay

## 2017-09-07 VITALS — Ht 65.0 in | Wt 126.6 lb

## 2017-09-07 DIAGNOSIS — Z1211 Encounter for screening for malignant neoplasm of colon: Secondary | ICD-10-CM

## 2017-09-07 MED ORDER — NA SULFATE-K SULFATE-MG SULF 17.5-3.13-1.6 GM/177ML PO SOLN: 1.0000 | Freq: Once | ORAL | 0 refills | Status: AC

## 2017-09-07 NOTE — Progress Notes (Signed)
Denies allergies to eggs or soy products. Denies complication of anesthesia or sedation. Denies use of weight loss medication. Denies use of O2.   Emmi instructions declined.  

## 2017-09-08 ENCOUNTER — Encounter: Payer: Self-pay | Admitting: Gastroenterology

## 2017-09-21 ENCOUNTER — Other Ambulatory Visit: Payer: Self-pay

## 2017-09-21 ENCOUNTER — Ambulatory Visit (AMBULATORY_SURGERY_CENTER): Payer: BC Managed Care – PPO | Admitting: Gastroenterology

## 2017-09-21 ENCOUNTER — Encounter: Payer: Self-pay | Admitting: Gastroenterology

## 2017-09-21 VITALS — BP 113/69 | HR 72 | Temp 98.6°F | Resp 16 | Ht 65.0 in | Wt 121.0 lb

## 2017-09-21 DIAGNOSIS — Z1211 Encounter for screening for malignant neoplasm of colon: Secondary | ICD-10-CM

## 2017-09-21 MED ORDER — SODIUM CHLORIDE 0.9 % IV SOLN: 500.0000 mL | Freq: Once | INTRAVENOUS | Status: AC

## 2017-09-21 NOTE — Patient Instructions (Signed)
**   Handouts given on diverticulosis and hemorrhoid banding **   YOU HAD AN ENDOSCOPIC PROCEDURE TODAY AT THE  ENDOSCOPY CENTER:   Refer to the procedure report that was given to you for any specific questions about what was found during the examination.  If the procedure report does not answer your questions, please call your gastroenterologist to clarify.  If you requested that your care partner not be given the details of your procedure findings, then the procedure report has been included in a sealed envelope for you to review at your convenience later.  YOU SHOULD EXPECT: Some feelings of bloating in the abdomen. Passage of more gas than usual.  Walking can help get rid of the air that was put into your GI tract during the procedure and reduce the bloating. If you had a lower endoscopy (such as a colonoscopy or flexible sigmoidoscopy) you may notice spotting of blood in your stool or on the toilet paper. If you underwent a bowel prep for your procedure, you may not have a normal bowel movement for a few days.  Please Note:  You might notice some irritation and congestion in your nose or some drainage.  This is from the oxygen used during your procedure.  There is no need for concern and it should clear up in a day or so.  SYMPTOMS TO REPORT IMMEDIATELY:   Following lower endoscopy (colonoscopy or flexible sigmoidoscopy):  Excessive amounts of blood in the stool  Significant tenderness or worsening of abdominal pains  Swelling of the abdomen that is new, acute  Fever of 100F or higher  For urgent or emergent issues, a gastroenterologist can be reached at any hour by calling [phone].   DIET:  We do recommend a small meal at first, but then you may proceed to your regular diet.  Drink plenty of fluids but you should avoid alcoholic beverages for 24 hours.  ACTIVITY:  You should plan to take it easy for the rest of today and you should NOT DRIVE or use heavy machinery until  tomorrow (because of the sedation medicines used during the test).    FOLLOW UP: Our staff will call the number listed on your records the next business day following your procedure to check on you and address any questions or concerns that you may have regarding the information given to you following your procedure. If we do not reach you, we will leave a message.  However, if you are feeling well and you are not experiencing any problems, there is no need to return our call.  We will assume that you have returned to your regular daily activities without incident.  If any biopsies were taken you will be contacted by phone or by letter within the next 1-3 weeks.  Please call us at [phone] if you have not heard about the biopsies in 3 weeks.    SIGNATURES/CONFIDENTIALITY: You and/or your care partner have signed paperwork which will be entered into your electronic medical record.  These signatures attest to the fact that that the information above on your After Visit Summary has been reviewed and is understood.  Full responsibility of the confidentiality of this discharge information lies with you and/or your care-partner.

## 2017-09-21 NOTE — Progress Notes (Signed)
Report to PACU, RN, vss, BBS= Clear.  

## 2017-09-21 NOTE — Op Note (Signed)
[facility] Patient Name: Kathryn Day Procedure Date: 09/21/2017 10:44 AM MRN: [id number] Endoscopist: Mauri Pole , MD Age: 52 Referring MD:  Date of Birth: 1965-07-06 Gender: Female Account #: [id number] Procedure:                Colonoscopy Indications:              Screening for colorectal malignant neoplasm Medicines:                Monitored Anesthesia Care Procedure:                Pre-Anesthesia Assessment:                           - Prior to the procedure, a History and Physical                            was performed, and patient medications and                            allergies were reviewed. The patient's tolerance of                            previous anesthesia was also reviewed. The risks                            and benefits of the procedure and the sedation                            options and risks were discussed with the patient.                            All questions were answered, and informed consent                            was obtained. Prior Anticoagulants: The patient has                            taken no previous anticoagulant or antiplatelet                            agents. ASA Grade Assessment: I - A normal, healthy                            patient. After reviewing the risks and benefits,                            the patient was deemed in satisfactory condition to                            undergo the procedure.                           After obtaining informed consent, the colonoscope  was passed under direct vision. Throughout the                            procedure, the patient's blood pressure, pulse, and                            oxygen saturations were monitored continuously. The                            Colonoscope was introduced through the anus and                            advanced to the the cecum, identified by                            appendiceal orifice and  ileocecal valve. The                            colonoscopy was performed without difficulty. The                            patient tolerated the procedure well. The quality                            of the bowel preparation was excellent. The                            ileocecal valve, appendiceal orifice, and rectum                            were photographed. Scope In: 10:58:07 AM Scope Out: 11:09:10 AM Scope Withdrawal Time: 0 hours 7 minutes 39 seconds  Total Procedure Duration: 0 hours 11 minutes 3 seconds  Findings:                 The perianal and digital rectal examinations were                            normal.                           Scattered small-mouthed diverticula were found in                            the sigmoid colon, descending colon, transverse                            colon and ascending colon.                           Non-bleeding internal hemorrhoids were found during                            retroflexion. The hemorrhoids were small.  The exam was otherwise without abnormality. Complications:            No immediate complications. Estimated Blood Loss:     Estimated blood loss: none. Impression:               - Mild diverticulosis in the sigmoid colon, in the                            descending colon, in the transverse colon and in                            the ascending colon.                           - Non-bleeding internal hemorrhoids.                           - The examination was otherwise normal.                           - No specimens collected. Recommendation:           - Patient has a contact number available for                            emergencies. The signs and symptoms of potential                            delayed complications were discussed with the                            patient. Return to normal activities tomorrow.                            Written discharge instructions were provided to the                             patient.                           - Resume previous diet.                           - Continue present medications.                           - Repeat colonoscopy in 10 years for screening                            purposes. Mauri Pole, MD 09/21/2017 11:13:03 AM This report has been signed electronically.

## 2017-09-21 NOTE — Progress Notes (Signed)
Pt's states no medical or surgical changes since previsit or office visit. 

## 2017-09-22 ENCOUNTER — Telehealth: Payer: Self-pay | Admitting: *Deleted

## 2017-09-22 NOTE — Telephone Encounter (Signed)
  Follow up Call-  Call back number 09/21/2017  Post procedure Call Back phone  # [phone]  Permission to leave phone message Yes  Some recent data might be hidden     Patient questions:  Do you have a fever, pain , or abdominal swelling? No. Pain Score  0 *  Have you tolerated food without any problems? Yes.    Have you been able to return to your normal activities? Yes.    Do you have any questions about your discharge instructions: Diet   No. Medications  No. Follow up visit  No.  Do you have questions or concerns about your Care? No.  Actions: * If pain score is 4 or above: No action needed, pain <4.

## 2019-01-18 ENCOUNTER — Other Ambulatory Visit: Payer: Self-pay | Admitting: Primary Care

## 2019-01-18 DIAGNOSIS — Z1231 Encounter for screening mammogram for malignant neoplasm of breast: Secondary | ICD-10-CM

## 2019-02-26 ENCOUNTER — Ambulatory Visit
Admission: RE | Admit: 2019-02-26 | Discharge: 2019-02-26 | Disposition: A | Discharge status: Home / Self Care | Payer: BC Managed Care – PPO | Source: Ambulatory Visit | Attending: Primary Care | Admitting: Primary Care

## 2019-02-26 ENCOUNTER — Other Ambulatory Visit: Payer: Self-pay

## 2019-02-26 DIAGNOSIS — Z1231 Encounter for screening mammogram for malignant neoplasm of breast: Secondary | ICD-10-CM

## 2019-03-05 ENCOUNTER — Ambulatory Visit: Payer: BC Managed Care – PPO

## 2019-03-11 ENCOUNTER — Telehealth: Payer: Self-pay

## 2019-03-11 NOTE — Telephone Encounter (Signed)
LVM w COVID screen, front door and back lab info 11.9.2020 TLJ

## 2019-03-13 ENCOUNTER — Other Ambulatory Visit: Payer: Self-pay | Admitting: Primary Care

## 2019-03-13 ENCOUNTER — Other Ambulatory Visit (INDEPENDENT_AMBULATORY_CARE_PROVIDER_SITE_OTHER): Payer: BC Managed Care – PPO

## 2019-03-13 DIAGNOSIS — Z0000 Encounter for general adult medical examination without abnormal findings: Secondary | ICD-10-CM

## 2019-03-13 LAB — COMPREHENSIVE METABOLIC PANEL
ALT: 19 U/L (ref 0–35)
AST: 23 U/L (ref 0–37)
Albumin: 4.6 g/dL (ref 3.5–5.2)
Alkaline Phosphatase: 58 U/L (ref 39–117)
BUN: 8 mg/dL (ref 6–23)
CO2: 27 mEq/L (ref 19–32)
Calcium: 9.3 mg/dL (ref 8.4–10.5)
Chloride: 103 mEq/L (ref 96–112)
Creatinine, Ser: 0.68 mg/dL (ref 0.40–1.20)
GFR: 90.87 mL/min (ref >60.00)
Glucose, Bld: 96 mg/dL (ref 70–99)
Potassium: 4 mEq/L (ref 3.5–5.1)
Sodium: 138 mEq/L (ref 135–145)
Total Bilirubin: 0.8 mg/dL (ref 0.2–1.2)
Total Protein: 6.7 g/dL (ref 6.0–8.3)

## 2019-03-13 LAB — LIPID PANEL
Cholesterol: 180 mg/dL (ref 0–200)
HDL: 71 mg/dL (ref >39.00)
LDL Cholesterol: 97 mg/dL (ref 0–99)
NonHDL: 108.69
Total CHOL/HDL Ratio: 3
Triglycerides: 56 mg/dL (ref 0.0–149.0)
VLDL: 11.2 mg/dL (ref 0.0–40.0)

## 2019-03-19 ENCOUNTER — Other Ambulatory Visit: Payer: Self-pay

## 2019-03-19 ENCOUNTER — Ambulatory Visit (INDEPENDENT_AMBULATORY_CARE_PROVIDER_SITE_OTHER): Payer: BC Managed Care – PPO | Admitting: Primary Care

## 2019-03-19 ENCOUNTER — Encounter: Payer: Self-pay | Admitting: Primary Care

## 2019-03-19 VITALS — BP 112/76 | HR 72 | Temp 97.8°F | Ht 65.0 in | Wt 116.5 lb

## 2019-03-19 DIAGNOSIS — Z23 Encounter for immunization: Secondary | ICD-10-CM | POA: Diagnosis not present

## 2019-03-19 DIAGNOSIS — R519 Headache, unspecified: Secondary | ICD-10-CM | POA: Insufficient documentation

## 2019-03-19 DIAGNOSIS — Z0000 Encounter for general adult medical examination without abnormal findings: Secondary | ICD-10-CM | POA: Diagnosis not present

## 2019-03-19 NOTE — Assessment & Plan Note (Signed)
Following with neurology, doing well on Nurtec since July 2020. Continue same.

## 2019-03-19 NOTE — Assessment & Plan Note (Signed)
Tetanus and influenza vaccinations UTD, first Shingrix provided today.  Pap smear UTD. Colonoscopy UTD, due in 2029. Encouraged a healthy diet with regular exercise. Exam today unremarkable. Labs reviewed.

## 2019-03-19 NOTE — Addendum Note (Signed)
Addended by: Jacqualin Combes on: 03/19/2019 04:11 PM   Modules accepted: Orders

## 2019-03-19 NOTE — Progress Notes (Signed)
Subjective:    Patient ID: Kathryn Day, female    DOB: [birth year], 53 y.o.   MRN: [id number]  HPI  [name] is a 53 year old female who presents today for complete physical.  Immunizations: -Tetanus: Completed in 2017 -Influenza: Completed this season  -Shingles: Never completed   Diet: She endorses a healthy diet. Salad, black beans, vegetables, fruit. Exercise: She is doing yoga nightly, sometimes in the morning. Rowing machine at times.   Eye exam: Completed in 2020 Dental exam: Completes semi annually   Pap Smear: Completed in 2019, negative  Mammogram: Completed in 2020 Colonoscopy: Completed in 2019, due in 2029  BP Readings from Last 3 Encounters:  03/19/19 112/76  09/21/17 113/69  07/26/17 106/68     Review of Systems  Constitutional: Negative for unexpected weight change.  HENT: Negative for rhinorrhea.   Respiratory: Negative for cough and shortness of breath.   Cardiovascular: Negative for chest pain.  Gastrointestinal: Negative for constipation and diarrhea.  Genitourinary: Negative for difficulty urinating.  Musculoskeletal: Negative for arthralgias and myalgias.  Skin: Negative for rash.  Allergic/Immunologic: Negative for environmental allergies.  Neurological: Negative for dizziness, numbness and headaches.  Psychiatric/Behavioral: The patient is not nervous/anxious.        Past Medical History:  Diagnosis Date  . PAC (premature atrial contraction) 10/18/2016  . Palpitations   . PVC (premature ventricular contraction) 10/18/2016  . Skin cancer    BASAL AND SQUAMOUS CELL     Social History   Socioeconomic History  . Marital status: Married    Spouse name: Not on file  . Number of children: Not on file  . Years of education: Not on file  . Highest education level: Not on file  Occupational History  . Not on file  Social Needs  . Financial resource strain: Not on file  . Food insecurity    Worry: Not on file    Inability: Not on  file  . Transportation needs    Medical: Not on file    Non-medical: Not on file  Tobacco Use  . Smoking status: Never Smoker  . Smokeless tobacco: Never Used  Substance and Sexual Activity  . Alcohol use: No  . Drug use: No  . Sexual activity: Not on file  Lifestyle  . Physical activity    Days per week: Not on file    Minutes per session: Not on file  . Stress: Not on file  Relationships  . Social Herbalist on phone: Not on file    Gets together: Not on file    Attends religious service: Not on file    Active member of club or organization: Not on file    Attends meetings of clubs or organizations: Not on file    Relationship status: Not on file  . Intimate partner violence    Fear of current or ex partner: Not on file    Emotionally abused: Not on file    Physically abused: Not on file    Forced sexual activity: Not on file  Other Topics Concern  . Not on file  Social History Narrative   Married.   No children.   Works as an Optometrist.   Enjoys spending time with family and being outdoors.    Past Surgical History:  Procedure Laterality Date  . KNEE SURGERY Left    [id number], 2001  . SKIN CANCER EXCISION     over 20 mostly on face and back  Family History  Problem Relation Age of Onset  . Heart attack Maternal Grandfather   . Heart disease Maternal Grandfather   . Atrial fibrillation Maternal Aunt   . Atrial fibrillation Cousin   . Stroke Maternal Grandmother   . Melanoma Maternal Grandmother   . Melanoma Paternal Grandmother   . Throat cancer Paternal Grandfather   . Cancer Paternal Grandfather   . Esophageal cancer Paternal Grandfather   . Ovarian cancer Maternal Aunt   . Hypertension Brother   . Colon cancer Neg Hx   . Liver cancer Neg Hx   . Pancreatic cancer Neg Hx   . Rectal cancer Neg Hx   . Stomach cancer Neg Hx     No Known Allergies  Current Outpatient Medications on File Prior to Visit  Medication Sig Dispense Refill   . ibuprofen (ADVIL,MOTRIN) 200 MG tablet Take 200 mg by mouth every 6 (six) hours as needed. 400 mg as needed for headaches.    . Misc. Devices MISC Nurtec ODT 75 mg one po at onset of migraine    . Rimegepant Sulfate (NURTEC) 75 MG TBDP     . sodium-potassium bicarbonate (ALKA-SELTZER GOLD) TBEF dissolvable tablet Take 1 tablet by mouth as needed.     Current Facility-Administered Medications on File Prior to Visit  Medication Dose Route Frequency Provider Last Rate Last Dose  . 0.9 %  sodium chloride infusion  500 mL Intravenous Once Nandigam, Kavitha V, MD        BP 112/76   Pulse 72   Temp 97.8 F (36.6 C) (Temporal)   Ht 5\' 5"  (1.651 m)   Wt 116 lb 8 oz (52.8 kg)   SpO2 98%   BMI 19.39 kg/m    Objective:   Physical Exam  Constitutional: She is oriented to person, place, and time. She appears well-nourished.  HENT:  Right Ear: Tympanic membrane and ear canal normal.  Left Ear: Tympanic membrane and ear canal normal.  Mouth/Throat: Oropharynx is clear and moist.  Eyes: Pupils are equal, round, and reactive to light. EOM are normal.  Neck: Neck supple.  Cardiovascular: Normal rate and regular rhythm.  Respiratory: Effort normal and breath sounds normal.  GI: Soft. Bowel sounds are normal. There is no abdominal tenderness.  Musculoskeletal: Normal range of motion.  Neurological: She is alert and oriented to person, place, and time.  Skin: Skin is warm and dry.  Psychiatric: She has a normal mood and affect.           Assessment & Plan:

## 2019-03-19 NOTE — Patient Instructions (Signed)
Continue exercising. You should be getting 150 minutes of moderate intensity exercise weekly.  Continue to work on a healthy diet. Ensure you are consuming 64 ounces of water daily.  Schedule a nurse visit for 2-6 months from today for the second Shingrix vaccine.  It was a pleasure to see you today!   Preventive Care 52-52 Years Old, Female Preventive care refers to visits with your health care provider and lifestyle choices that can promote health and wellness. This includes:  A yearly physical exam. This may also be called an annual well check.  Regular dental visits and eye exams.  Immunizations.  Screening for certain conditions.  Healthy lifestyle choices, such as eating a healthy diet, getting regular exercise, not using drugs or products that contain nicotine and tobacco, and limiting alcohol use. What can I expect for my preventive care visit? Physical exam Your health care provider will check your:  Height and weight. This may be used to calculate body mass index (BMI), which tells if you are at a healthy weight.  Heart rate and blood pressure.  Skin for abnormal spots. Counseling Your health care provider may ask you questions about your:  Alcohol, tobacco, and drug use.  Emotional well-being.  Home and relationship well-being.  Sexual activity.  Eating habits.  Work and work Statistician.  Method of birth control.  Menstrual cycle.  Pregnancy history. What immunizations do I need?  Influenza (flu) vaccine  This is recommended every year. Tetanus, diphtheria, and pertussis (Tdap) vaccine  You may need a Td booster every 10 years. Varicella (chickenpox) vaccine  You may need this if you have not been vaccinated. Zoster (shingles) vaccine  You may need this after age 7. Measles, mumps, and rubella (MMR) vaccine  You may need at least one dose of MMR if you were born in 1957 or later. You may also need a second dose. Pneumococcal conjugate  (PCV13) vaccine  You may need this if you have certain conditions and were not previously vaccinated. Pneumococcal polysaccharide (PPSV23) vaccine  You may need one or two doses if you smoke cigarettes or if you have certain conditions. Meningococcal conjugate (MenACWY) vaccine  You may need this if you have certain conditions. Hepatitis A vaccine  You may need this if you have certain conditions or if you travel or work in places where you may be exposed to hepatitis A. Hepatitis B vaccine  You may need this if you have certain conditions or if you travel or work in places where you may be exposed to hepatitis B. Haemophilus influenzae type b (Hib) vaccine  You may need this if you have certain conditions. Human papillomavirus (HPV) vaccine  If recommended by your health care provider, you may need three doses over 6 months. You may receive vaccines as individual doses or as more than one vaccine together in one shot (combination vaccines). Talk with your health care provider about the risks and benefits of combination vaccines. What tests do I need? Blood tests  Lipid and cholesterol levels. These may be checked every 5 years, or more frequently if you are over 30 years old.  Hepatitis C test.  Hepatitis B test. Screening  Lung cancer screening. You may have this screening every year starting at age 11 if you have a 30-pack-year history of smoking and currently smoke or have quit within the past 15 years.  Colorectal cancer screening. All adults should have this screening starting at age 42 and continuing until age 33. Your health  care provider may recommend screening at age 66 if you are at increased risk. You will have tests every 1-10 years, depending on your results and the type of screening test.  Diabetes screening. This is done by checking your blood sugar (glucose) after you have not eaten for a while (fasting). You may have this done every 1-3 years.  Mammogram. This  may be done every 1-2 years. Talk with your health care provider about when you should start having regular mammograms. This may depend on whether you have a family history of breast cancer.  BRCA-related cancer screening. This may be done if you have a family history of breast, ovarian, tubal, or peritoneal cancers.  Pelvic exam and Pap test. This may be done every 3 years starting at age 21. Starting at age 43, this may be done every 5 years if you have a Pap test in combination with an HPV test. Other tests  Sexually transmitted disease (STD) testing.  Bone density scan. This is done to screen for osteoporosis. You may have this scan if you are at high risk for osteoporosis. Follow these instructions at home: Eating and drinking  Eat a diet that includes fresh fruits and vegetables, whole grains, lean protein, and low-fat dairy.  Take vitamin and mineral supplements as recommended by your health care provider.  Do not drink alcohol if: ? Your health care provider tells you not to drink. ? You are pregnant, may be pregnant, or are planning to become pregnant.  If you drink alcohol: ? Limit how much you have to 0-1 drink a day. ? Be aware of how much alcohol is in your drink. In the [place] one drink equals one 12 oz bottle of beer (355 mL), one 5 oz glass of wine (148 mL), or one 1 oz glass of hard liquor (44 mL). Lifestyle  Take daily care of your teeth and gums.  Stay active. Exercise for at least 30 minutes on 5 or more days each week.  Do not use any products that contain nicotine or tobacco, such as cigarettes, e-cigarettes, and chewing tobacco. If you need help quitting, ask your health care provider.  If you are sexually active, practice safe sex. Use a condom or other form of birth control (contraception) in order to prevent pregnancy and STIs (sexually transmitted infections).  If told by your health care provider, take low-dose aspirin daily starting at age 78. What's  next?  Visit your health care provider once a year for a well check visit.  Ask your health care provider how often you should have your eyes and teeth checked.  Stay up to date on all vaccines. This information is not intended to replace advice given to you by your health care provider. Make sure you discuss any questions you have with your health care provider. Document Released: 05/15/2015 Document Revised: 12/28/2017 Document Reviewed: 12/28/2017 Elsevier Patient Education  2020 [employer].

## 2019-08-12 ENCOUNTER — Telehealth: Payer: Self-pay

## 2019-08-12 NOTE — Telephone Encounter (Signed)
Pt has [facility] apt tomorrow. Pt needs screened for covid symptoms.  LVM

## 2019-08-13 ENCOUNTER — Ambulatory Visit (INDEPENDENT_AMBULATORY_CARE_PROVIDER_SITE_OTHER): Payer: BC Managed Care – PPO

## 2019-08-13 DIAGNOSIS — Z23 Encounter for immunization: Secondary | ICD-10-CM

## 2019-08-13 NOTE — Progress Notes (Signed)
Per orders of Rogue Jury, NP, injection of shingrix given by Randall An. Patient tolerated injection well.

## 2020-02-26 ENCOUNTER — Other Ambulatory Visit: Payer: Self-pay | Admitting: Primary Care

## 2020-02-26 DIAGNOSIS — Z1231 Encounter for screening mammogram for malignant neoplasm of breast: Secondary | ICD-10-CM

## 2020-02-27 ENCOUNTER — Ambulatory Visit
Admission: RE | Admit: 2020-02-27 | Discharge: 2020-02-27 | Disposition: A | Discharge status: Home / Self Care | Payer: BC Managed Care – PPO | Source: Ambulatory Visit | Attending: Primary Care | Admitting: Primary Care

## 2020-02-27 ENCOUNTER — Other Ambulatory Visit: Payer: Self-pay

## 2020-02-27 DIAGNOSIS — Z1231 Encounter for screening mammogram for malignant neoplasm of breast: Secondary | ICD-10-CM

## 2020-04-01 ENCOUNTER — Other Ambulatory Visit: Payer: Self-pay

## 2020-04-01 ENCOUNTER — Other Ambulatory Visit (INDEPENDENT_AMBULATORY_CARE_PROVIDER_SITE_OTHER): Payer: BC Managed Care – PPO

## 2020-04-01 ENCOUNTER — Other Ambulatory Visit: Payer: Self-pay | Admitting: Primary Care

## 2020-04-01 DIAGNOSIS — Z0000 Encounter for general adult medical examination without abnormal findings: Secondary | ICD-10-CM

## 2020-04-01 LAB — LIPID PANEL
Cholesterol: 176 mg/dL (ref 0–200)
HDL: 74.8 mg/dL (ref >39.00)
LDL Cholesterol: 90 mg/dL (ref 0–99)
NonHDL: 101.57
Total CHOL/HDL Ratio: 2
Triglycerides: 58 mg/dL (ref 0.0–149.0)
VLDL: 11.6 mg/dL (ref 0.0–40.0)

## 2020-04-01 LAB — COMPREHENSIVE METABOLIC PANEL
ALT: 17 U/L (ref 0–35)
AST: 21 U/L (ref 0–37)
Albumin: 4.5 g/dL (ref 3.5–5.2)
Alkaline Phosphatase: 62 U/L (ref 39–117)
BUN: 10 mg/dL (ref 6–23)
CO2: 31 mEq/L (ref 19–32)
Calcium: 9.3 mg/dL (ref 8.4–10.5)
Chloride: 100 mEq/L (ref 96–112)
Creatinine, Ser: 0.73 mg/dL (ref 0.40–1.20)
GFR: 94.16 mL/min (ref >60.00)
Glucose, Bld: 92 mg/dL (ref 70–99)
Potassium: 4.2 mEq/L (ref 3.5–5.1)
Sodium: 139 mEq/L (ref 135–145)
Total Bilirubin: 0.8 mg/dL (ref 0.2–1.2)
Total Protein: 6.6 g/dL (ref 6.0–8.3)

## 2020-04-01 LAB — CBC
HCT: 40.7 % (ref 36.0–46.0)
Hemoglobin: 13.7 g/dL (ref 12.0–15.0)
MCHC: 33.6 g/dL (ref 30.0–36.0)
MCV: 90.8 fl (ref 78.0–100.0)
Platelets: 236 10*3/uL (ref 150.0–400.0)
RBC: 4.49 Mil/uL (ref 3.87–5.11)
RDW: 12.7 % (ref 11.5–15.5)
WBC: 5.5 10*3/uL (ref 4.0–10.5)

## 2020-04-07 ENCOUNTER — Encounter: Payer: BC Managed Care – PPO | Admitting: Primary Care

## 2020-04-07 ENCOUNTER — Ambulatory Visit: Payer: BC Managed Care – PPO | Admitting: Primary Care

## 2020-04-07 ENCOUNTER — Encounter: Payer: Self-pay | Admitting: Primary Care

## 2020-04-07 ENCOUNTER — Other Ambulatory Visit: Payer: Self-pay

## 2020-04-07 VITALS — BP 102/60 | HR 72 | Temp 97.3°F | Ht 65.0 in | Wt 119.0 lb

## 2020-04-07 DIAGNOSIS — R519 Headache, unspecified: Secondary | ICD-10-CM

## 2020-04-07 DIAGNOSIS — Z23 Encounter for immunization: Secondary | ICD-10-CM

## 2020-04-07 DIAGNOSIS — Z0000 Encounter for general adult medical examination without abnormal findings: Secondary | ICD-10-CM

## 2020-04-07 NOTE — Assessment & Plan Note (Signed)
Immunizations UTD, influenza vaccination UTD. Pap smear UTD. Due 2022. Mammogram UTD. Colonoscopy UTD, due in 2029.  Discussed the importance of a healthy diet and regular exercise in order for weight loss, and to reduce the risk of any potential medical problems.  Exam today unremarkable. Labs reviewed.

## 2020-04-07 NOTE — Patient Instructions (Signed)
Continue exercising. You should be getting 150 minutes of moderate intensity exercise weekly.  Continue to work on a healthy diet. Ensure you are consuming 64 ounces of water daily.  It was a pleasure to see you today!   Preventive Care 53-53 Years Old, Female Preventive care refers to visits with your health care provider and lifestyle choices that can promote health and wellness. This includes:  A yearly physical exam. This may also be called an annual well check.  Regular dental visits and eye exams.  Immunizations.  Screening for certain conditions.  Healthy lifestyle choices, such as eating a healthy diet, getting regular exercise, not using drugs or products that contain nicotine and tobacco, and limiting alcohol use. What can I expect for my preventive care visit? Physical exam Your health care provider will check your:  Height and weight. This may be used to calculate body mass index (BMI), which tells if you are at a healthy weight.  Heart rate and blood pressure.  Skin for abnormal spots. Counseling Your health care provider may ask you questions about your:  Alcohol, tobacco, and drug use.  Emotional well-being.  Home and relationship well-being.  Sexual activity.  Eating habits.  Work and work Statistician.  Method of birth control.  Menstrual cycle.  Pregnancy history. What immunizations do I need?  Influenza (flu) vaccine  This is recommended every year. Tetanus, diphtheria, and pertussis (Tdap) vaccine  You may need a Td booster every 10 years. Varicella (chickenpox) vaccine  You may need this if you have not been vaccinated. Zoster (shingles) vaccine  You may need this after age 53. Measles, mumps, and rubella (MMR) vaccine  You may need at least one dose of MMR if you were born in 1957 or later. You may also need a second dose. Pneumococcal conjugate (PCV13) vaccine  You may need this if you have certain conditions and were not  previously vaccinated. Pneumococcal polysaccharide (PPSV23) vaccine  You may need one or two doses if you smoke cigarettes or if you have certain conditions. Meningococcal conjugate (MenACWY) vaccine  You may need this if you have certain conditions. Hepatitis A vaccine  You may need this if you have certain conditions or if you travel or work in places where you may be exposed to hepatitis A. Hepatitis B vaccine  You may need this if you have certain conditions or if you travel or work in places where you may be exposed to hepatitis B. Haemophilus influenzae type b (Hib) vaccine  You may need this if you have certain conditions. Human papillomavirus (HPV) vaccine  If recommended by your health care provider, you may need three doses over 6 months. You may receive vaccines as individual doses or as more than one vaccine together in one shot (combination vaccines). Talk with your health care provider about the risks and benefits of combination vaccines. What tests do I need? Blood tests  Lipid and cholesterol levels. These may be checked every 5 years, or more frequently if you are over 53 years old.  Hepatitis C test.  Hepatitis B test. Screening  Lung cancer screening. You may have this screening every year starting at age 53 if you have a 30-pack-year history of smoking and currently smoke or have quit within the past 15 years.  Colorectal cancer screening. All adults should have this screening starting at age 53 and continuing until age 53. Your health care provider may recommend screening at age 53 if you are at increased risk. You  will have tests every 1-10 years, depending on your results and the type of screening test.  Diabetes screening. This is done by checking your blood sugar (glucose) after you have not eaten for a while (fasting). You may have this done every 1-3 years.  Mammogram. This may be done every 1-2 years. Talk with your health care provider about when you  should start having regular mammograms. This may depend on whether you have a family history of breast cancer.  BRCA-related cancer screening. This may be done if you have a family history of breast, ovarian, tubal, or peritoneal cancers.  Pelvic exam and Pap test. This may be done every 3 years starting at age 53. Starting at age 53, this may be done every 5 years if you have a Pap test in combination with an HPV test. Other tests  Sexually transmitted disease (STD) testing.  Bone density scan. This is done to screen for osteoporosis. You may have this scan if you are at high risk for osteoporosis. Follow these instructions at home: Eating and drinking  Eat a diet that includes fresh fruits and vegetables, whole grains, lean protein, and low-fat dairy.  Take vitamin and mineral supplements as recommended by your health care provider.  Do not drink alcohol if: ? Your health care provider tells you not to drink. ? You are pregnant, may be pregnant, or are planning to become pregnant.  If you drink alcohol: ? Limit how much you have to 0-1 drink a day. ? Be aware of how much alcohol is in your drink. In the [place] one drink equals one 12 oz bottle of beer (355 mL), one 5 oz glass of wine (148 mL), or one 1 oz glass of hard liquor (44 mL). Lifestyle  Take daily care of your teeth and gums.  Stay active. Exercise for at least 30 minutes on 5 or more days each week.  Do not use any products that contain nicotine or tobacco, such as cigarettes, e-cigarettes, and chewing tobacco. If you need help quitting, ask your health care provider.  If you are sexually active, practice safe sex. Use a condom or other form of birth control (contraception) in order to prevent pregnancy and STIs (sexually transmitted infections).  If told by your health care provider, take low-dose aspirin daily starting at age 53. What's next?  Visit your health care provider once a year for a well check  visit.  Ask your health care provider how often you should have your eyes and teeth checked.  Stay up to date on all vaccines. This information is not intended to replace advice given to you by your health care provider. Make sure you discuss any questions you have with your health care provider. Document Revised: 12/28/2017 Document Reviewed: 12/28/2017 Elsevier Patient Education  2020 [employer].

## 2020-04-07 NOTE — Progress Notes (Signed)
Subjective:    Patient ID: Kathryn Day, female    DOB: 07-13-1965, 54 y.o.   MRN: [id number]  HPI  This visit occurred during the SARS-CoV-2 public health emergency.  Safety protocols were in place, including screening questions prior to the visit, additional usage of staff PPE, and extensive cleaning of exam room while observing appropriate contact time as indicated for disinfecting solutions.   Kathryn Day is a 54 year old female who presents today for complete physical.  Immunizations: -Tetanus: Completed in 2017 -Influenza: Due today -Shingles: Completed Shingrix -Covid-19: Completed series  Diet: She endorses a healthy diet.  Exercise: She is exercising daily  Eye exam: Completed in 2021 Dental exam: Completes   Pap Smear: Completed in 2019 Mammogram: Completed in 2021 Colonoscopy: Completed in 2019, due in 2029 Hep C Screen: Due  BP Readings from Last 3 Encounters:  04/07/20 102/60  03/19/19 112/76  09/21/17 113/69      Review of Systems  Constitutional: Negative for unexpected weight change.  HENT: Negative for rhinorrhea.   Eyes: Negative for visual disturbance.  Respiratory: Negative for cough and shortness of breath.   Cardiovascular: Negative for chest pain.  Gastrointestinal: Negative for constipation and diarrhea.  Genitourinary: Negative for difficulty urinating.  Musculoskeletal: Negative for arthralgias and myalgias.  Skin: Negative for rash.  Allergic/Immunologic: Positive for environmental allergies.  Neurological: Positive for headaches. Negative for dizziness and numbness.  Psychiatric/Behavioral: The patient is not nervous/anxious.        Past Medical History:  Diagnosis Date  . PAC (premature atrial contraction) 10/18/2016  . Palpitations   . PVC (premature ventricular contraction) 10/18/2016  . Skin cancer    BASAL AND SQUAMOUS CELL     Social History   Socioeconomic History  . Marital status: Married    Spouse name: Not on  file  . Number of children: Not on file  . Years of education: Not on file  . Highest education level: Not on file  Occupational History  . Not on file  Tobacco Use  . Smoking status: Never Smoker  . Smokeless tobacco: Never Used  Vaping Use  . Vaping Use: Never used  Substance and Sexual Activity  . Alcohol use: No  . Drug use: No  . Sexual activity: Not on file  Other Topics Concern  . Not on file  Social History Narrative   Married.   No children.   Works as an Optometrist.   Enjoys spending time with family and being outdoors.   Social Determinants of Health   Financial Resource Strain:   . Difficulty of Paying Living Expenses: Not on file  Food Insecurity:   . Worried About Charity fundraiser in the Last Year: Not on file  . Ran Out of Food in the Last Year: Not on file  Transportation Needs:   . Lack of Transportation (Medical): Not on file  . Lack of Transportation (Non-Medical): Not on file  Physical Activity:   . Days of Exercise per Week: Not on file  . Minutes of Exercise per Session: Not on file  Stress:   . Feeling of Stress : Not on file  Social Connections:   . Frequency of Communication with Friends and Family: Not on file  . Frequency of Social Gatherings with Friends and Family: Not on file  . Attends Religious Services: Not on file  . Active Member of Clubs or Organizations: Not on file  . Attends Archivist Meetings: Not on file  .  Marital Status: Not on file  Intimate Partner Violence:   . Fear of Current or Ex-Partner: Not on file  . Emotionally Abused: Not on file  . Physically Abused: Not on file  . Sexually Abused: Not on file    Past Surgical History:  Procedure Laterality Date  . KNEE SURGERY Left    [id number], 2001  . SKIN CANCER EXCISION     over 21 mostly on face and back    Family History  Problem Relation Age of Onset  . Heart attack Maternal Grandfather   . Heart disease Maternal Grandfather   . Atrial  fibrillation Maternal Aunt   . Atrial fibrillation Cousin   . Stroke Maternal Grandmother   . Melanoma Maternal Grandmother   . Melanoma Paternal Grandmother   . Throat cancer Paternal Grandfather   . Cancer Paternal Grandfather   . Esophageal cancer Paternal Grandfather   . Ovarian cancer Maternal Aunt   . Hypertension Brother   . Colon cancer Neg Hx   . Liver cancer Neg Hx   . Pancreatic cancer Neg Hx   . Rectal cancer Neg Hx   . Stomach cancer Neg Hx     No Known Allergies  Current Outpatient Medications on File Prior to Visit  Medication Sig Dispense Refill  . aspirin 325 MG tablet     . Rimegepant Sulfate (NURTEC) 75 MG TBDP     . rizatriptan (MAXALT) 10 MG tablet Take by mouth.    . Misc. Devices MISC Nurtec ODT 75 mg one po at onset of migraine     Current Facility-Administered Medications on File Prior to Visit  Medication Dose Route Frequency Provider Last Rate Last Admin  . 0.9 %  sodium chloride infusion  500 mL Intravenous Once Nandigam, Kavitha V, MD        BP 102/60   Pulse 72   Temp (!) 97.3 F (36.3 C) (Temporal)   Ht 5\' 5"  (1.651 m)   Wt 119 lb (54 kg)   SpO2 97%   BMI 19.80 kg/m    Objective:   Physical Exam HENT:     Right Ear: Tympanic membrane and ear canal normal.     Left Ear: Tympanic membrane and ear canal normal.  Eyes:     Pupils: Pupils are equal, round, and reactive to light.  Cardiovascular:     Rate and Rhythm: Normal rate and regular rhythm.  Pulmonary:     Effort: Pulmonary effort is normal.     Breath sounds: Normal breath sounds.  Abdominal:     General: Bowel sounds are normal.     Palpations: Abdomen is soft.     Tenderness: There is no abdominal tenderness.  Musculoskeletal:        General: Normal range of motion.     Cervical back: Neck supple.  Skin:    General: Skin is warm and dry.  Neurological:     Mental Status: She is alert and oriented to person, place, and time.     Cranial Nerves: No cranial nerve  deficit.     Deep Tendon Reflexes:     Reflex Scores:      Patellar reflexes are 2+ on the right side and 2+ on the left side. Psychiatric:        Mood and Affect: Mood normal.            Assessment & Plan:

## 2020-04-07 NOTE — Assessment & Plan Note (Signed)
Doing well Nurtec PRN for which she takes 2-3 times monthly on average, takes a half tablet.   Continue current regimen.  She has not yet tried Maxalt. Will have to try two triptans prior to resuming Nurtec.

## 2020-12-16 IMAGING — MG DIGITAL SCREENING BILAT W/ TOMO W/ CAD
6 of 10 series · 6 of 30 positions shown · non-contrast
Comparison: Previous exam(s).

CLINICAL DATA: Screening.

EXAM:
DIGITAL SCREENING BILATERAL MAMMOGRAM WITH TOMO AND CAD

[L MLO synth-2D]
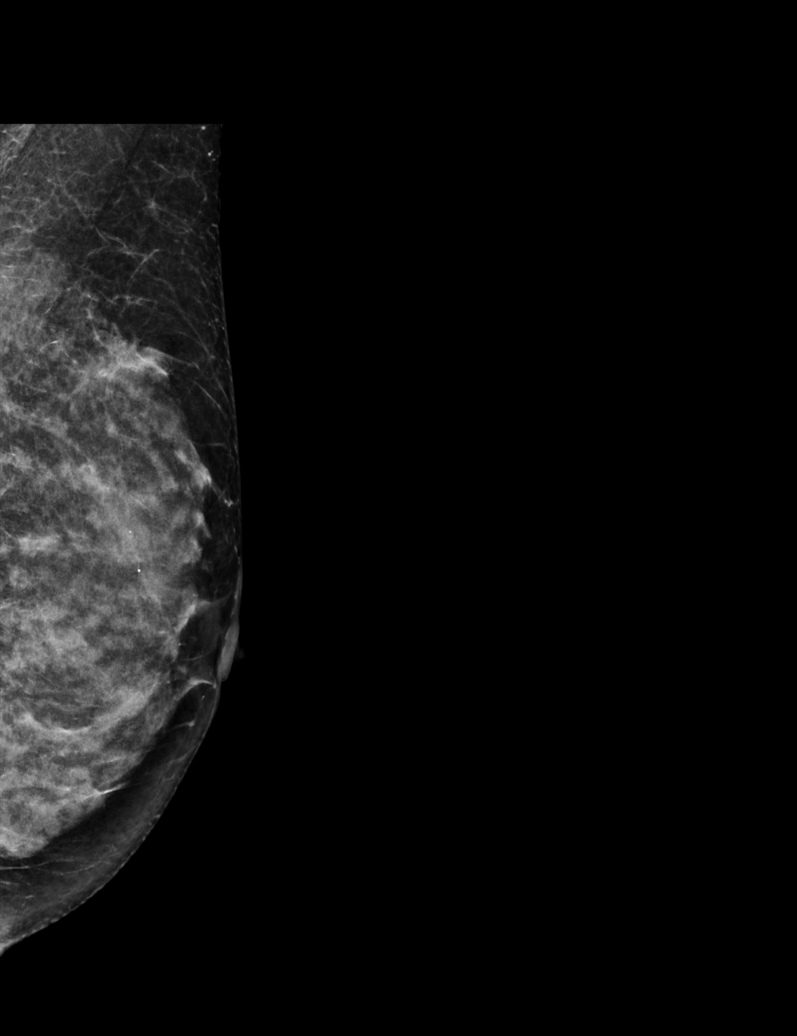

[R MLO synth-2D]
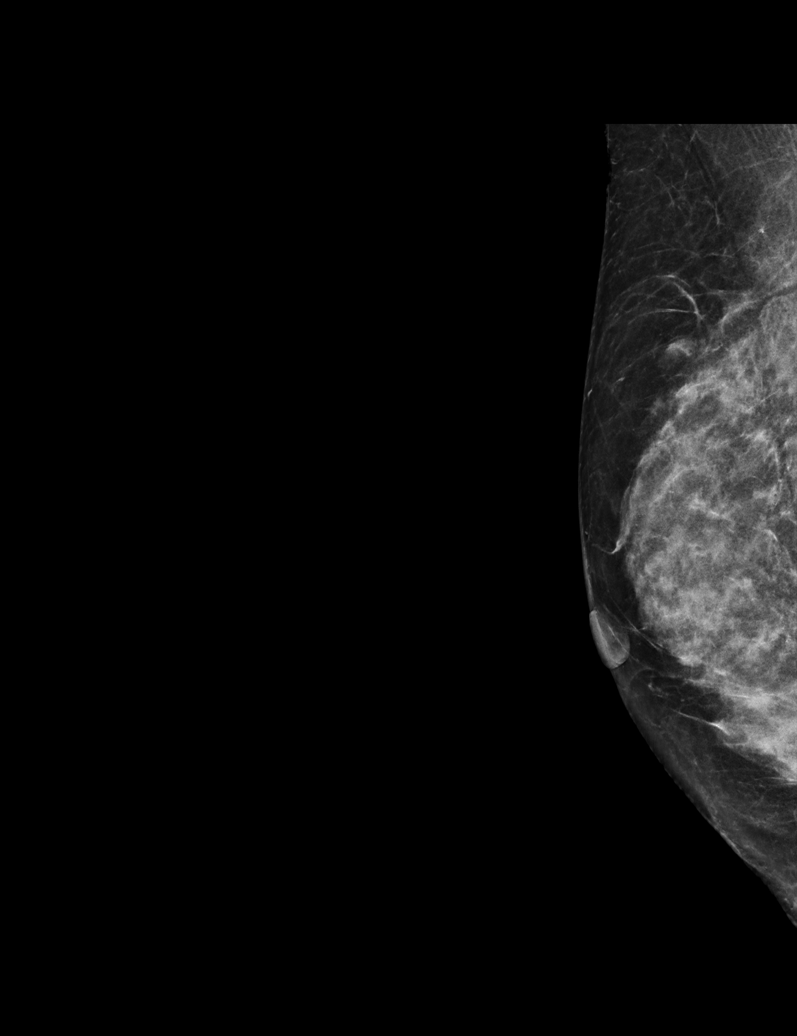

[L CC synth-2D]
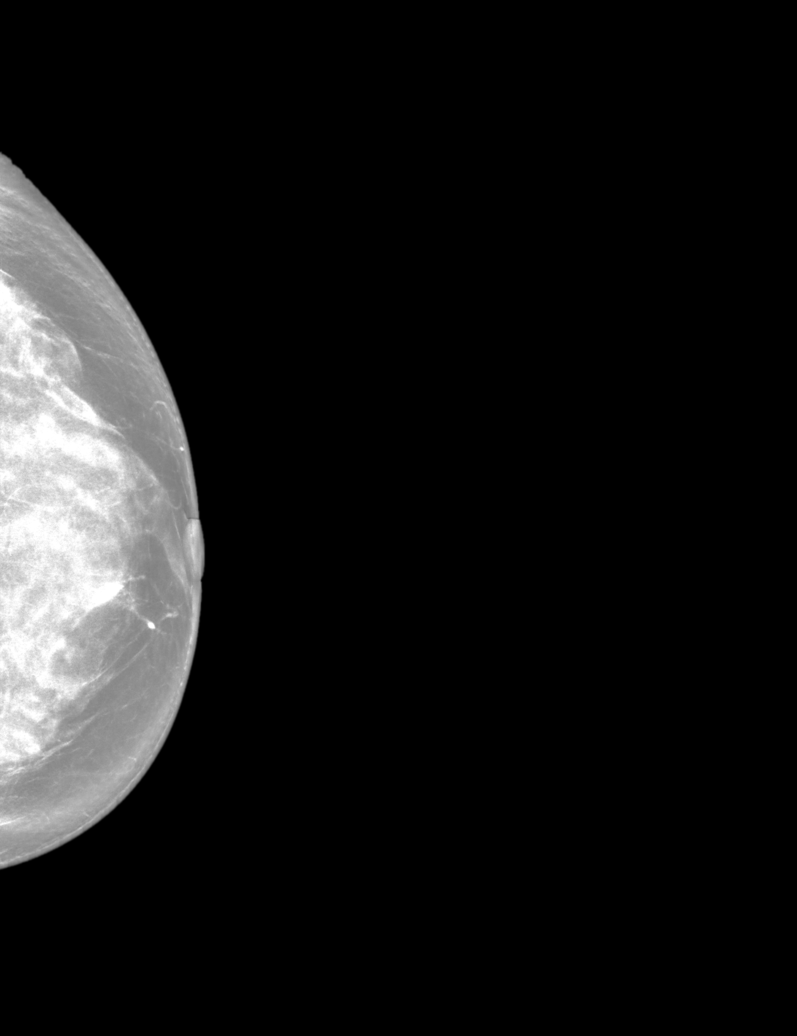

[R CC synth-2D (1 of 2)]
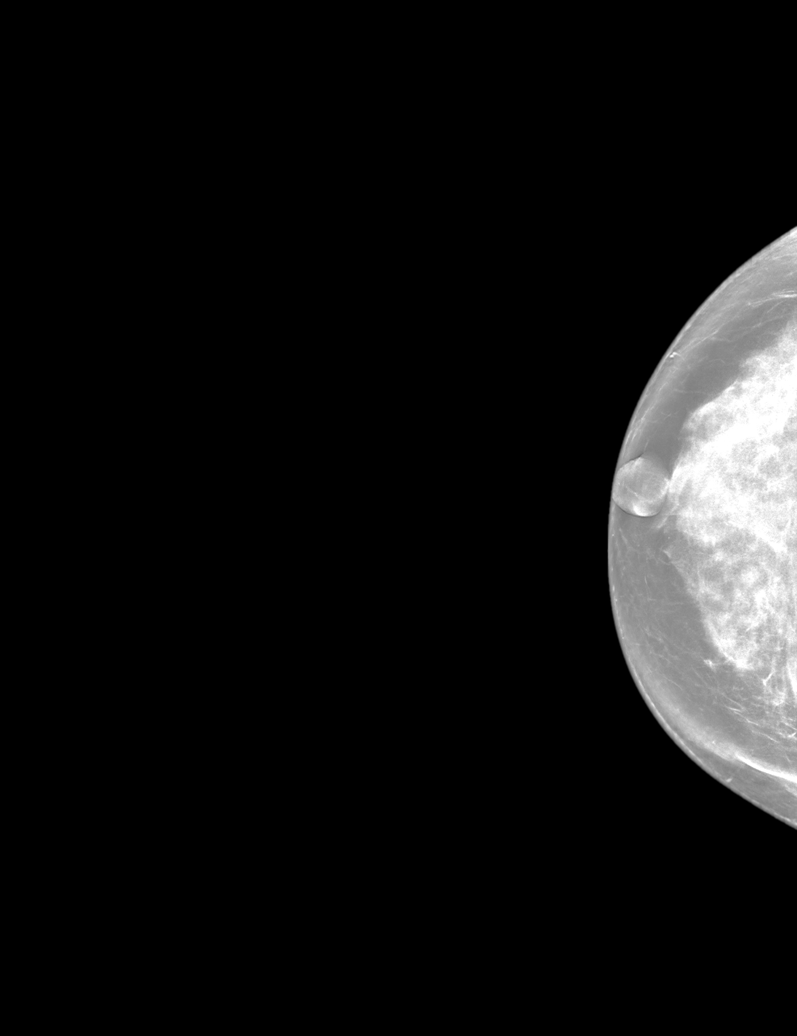

[R CC synth-2D (2 of 2)]
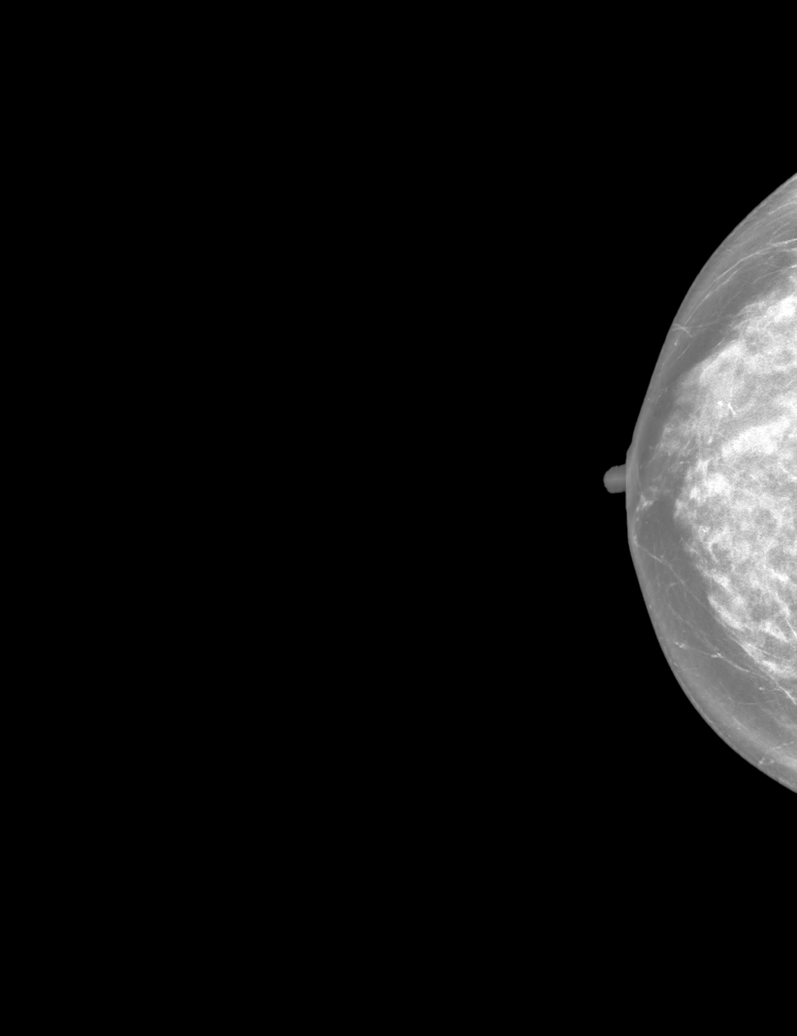

[R CC tomo · tomo slice 35/70.0]
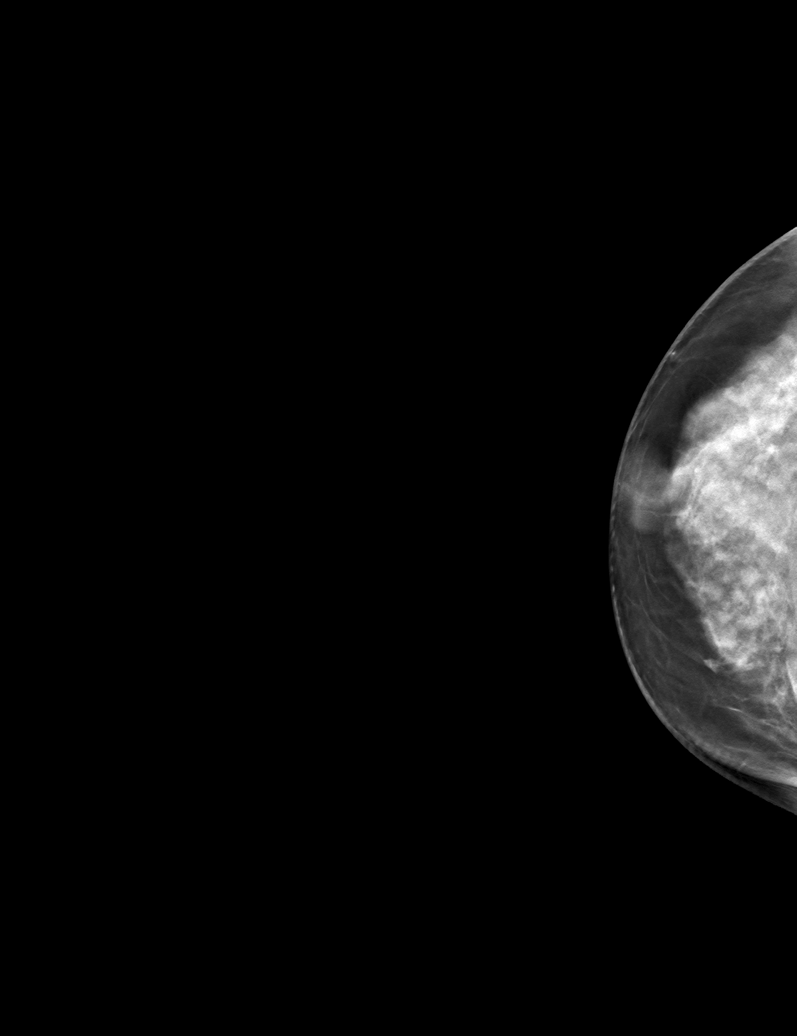

[6 of 30 positions shown; findings below may reference images not displayed]

ACR Breast Density Category d: The breast tissue is extremely dense,
which lowers the sensitivity of mammography
FINDINGS: There are no findings suspicious for malignancy. Images were
processed with CAD.
IMPRESSION: No mammographic evidence of malignancy. A result letter of this
screening mammogram will be mailed directly to the patient.

RECOMMENDATION:
Screening mammogram in one year. (Code:WO-0-ZI0)

BI-RADS CATEGORY  1: Negative.

## 2021-04-02 ENCOUNTER — Other Ambulatory Visit: Payer: Self-pay | Admitting: Primary Care

## 2021-04-02 DIAGNOSIS — Z1231 Encounter for screening mammogram for malignant neoplasm of breast: Secondary | ICD-10-CM

## 2021-04-07 ENCOUNTER — Ambulatory Visit: Payer: BC Managed Care – PPO

## 2021-12-07 ENCOUNTER — Other Ambulatory Visit: Payer: Self-pay | Admitting: Internal Medicine

## 2021-12-07 DIAGNOSIS — Z1231 Encounter for screening mammogram for malignant neoplasm of breast: Secondary | ICD-10-CM

## 2022-01-07 ENCOUNTER — Ambulatory Visit: Payer: Self-pay

## 2023-04-25 ENCOUNTER — Other Ambulatory Visit (HOSPITAL_COMMUNITY): Payer: Self-pay

## 2023-04-25 MED ORDER — NURTEC 75 MG PO TBDP
75.0000 mg | ORAL_TABLET | ORAL | 3 refills | Status: DC | PRN
  Filled 2023-04-25: qty 15, 30d supply, fill #0

## 2023-04-25 MED ORDER — NURTEC 75 MG PO TBDP
75.0000 mg | ORAL_TABLET | Freq: Every day | ORAL | 3 refills | Status: DC
  Filled 2023-04-25: qty 16, 30d supply, fill #0
  Filled 2023-05-20: qty 16, 30d supply, fill #1

## 2023-04-28 ENCOUNTER — Other Ambulatory Visit (HOSPITAL_COMMUNITY): Payer: Self-pay

## 2023-05-20 ENCOUNTER — Other Ambulatory Visit (HOSPITAL_COMMUNITY): Payer: Self-pay

## 2023-05-22 ENCOUNTER — Other Ambulatory Visit (HOSPITAL_COMMUNITY): Payer: Self-pay

## 2023-05-22 MED ORDER — ESTRADIOL 0.1 MG/GM VA CREA
0.5000 g | TOPICAL_CREAM | Freq: Every evening | VAGINAL | 2 refills | Status: AC
  Filled 2023-05-22: qty 42.5, 30d supply, fill #0
  Filled 2023-08-09 (×2): qty 42.5, 30d supply, fill #1

## 2023-05-25 ENCOUNTER — Other Ambulatory Visit (HOSPITAL_COMMUNITY): Payer: Self-pay

## 2023-06-16 ENCOUNTER — Other Ambulatory Visit (HOSPITAL_COMMUNITY): Payer: Self-pay

## 2023-06-16 MED ORDER — NURTEC 75 MG PO TBDP
75.0000 mg | ORAL_TABLET | Freq: Every day | ORAL | 0 refills | Status: DC
  Filled 2023-06-16: qty 16, 30d supply, fill #0

## 2023-07-13 ENCOUNTER — Other Ambulatory Visit (HOSPITAL_COMMUNITY): Payer: Self-pay

## 2023-07-13 MED ORDER — RIMEGEPANT SULFATE 75 MG PO TBDP
75.0000 mg | ORAL_TABLET | Freq: Every day | ORAL | 0 refills | Status: DC
  Filled 2023-07-13: qty 16, 30d supply, fill #0

## 2023-08-09 ENCOUNTER — Other Ambulatory Visit (HOSPITAL_COMMUNITY): Payer: Self-pay

## 2023-08-10 ENCOUNTER — Other Ambulatory Visit (HOSPITAL_COMMUNITY): Payer: Self-pay

## 2023-08-10 MED ORDER — RIMEGEPANT SULFATE 75 MG PO TBDP
75.0000 mg | ORAL_TABLET | Freq: Every day | ORAL | 6 refills | Status: AC
  Filled 2023-08-10: qty 16, 30d supply, fill #0
  Filled 2023-09-08: qty 16, 30d supply, fill #1
  Filled 2023-10-07 – 2023-10-09 (×3): qty 16, 30d supply, fill #2
  Filled 2023-11-06: qty 16, 30d supply, fill #3
  Filled 2023-12-01: qty 16, 30d supply, fill #4
  Filled 2024-03-22: qty 16, 30d supply, fill #5
  Filled 2024-04-17: qty 16, 30d supply, fill #6

## 2023-08-11 ENCOUNTER — Other Ambulatory Visit (HOSPITAL_COMMUNITY): Payer: Self-pay

## 2023-09-08 ENCOUNTER — Other Ambulatory Visit (HOSPITAL_COMMUNITY): Payer: Self-pay

## 2023-10-07 ENCOUNTER — Other Ambulatory Visit (HOSPITAL_COMMUNITY): Payer: Self-pay

## 2023-10-09 ENCOUNTER — Other Ambulatory Visit (HOSPITAL_COMMUNITY): Payer: Self-pay

## 2023-11-06 ENCOUNTER — Other Ambulatory Visit (HOSPITAL_COMMUNITY): Payer: Self-pay

## 2023-12-28 ENCOUNTER — Other Ambulatory Visit (HOSPITAL_COMMUNITY): Payer: Self-pay

## 2023-12-28 MED ORDER — NURTEC 75 MG PO TBDP
75.0000 mg | ORAL_TABLET | Freq: Every day | ORAL | 6 refills | Status: AC
  Filled 2023-12-28: qty 16, 30d supply, fill #0
  Filled 2024-01-25: qty 16, 30d supply, fill #1
  Filled 2024-02-23: qty 16, 30d supply, fill #2
  Filled 2024-05-15: qty 16, 30d supply, fill #3

## 2024-01-16 ENCOUNTER — Other Ambulatory Visit (HOSPITAL_COMMUNITY): Payer: Self-pay

## 2024-01-25 ENCOUNTER — Other Ambulatory Visit (HOSPITAL_COMMUNITY): Payer: Self-pay

## 2024-02-23 ENCOUNTER — Other Ambulatory Visit (HOSPITAL_COMMUNITY): Payer: Self-pay

## 2024-03-22 ENCOUNTER — Other Ambulatory Visit (HOSPITAL_COMMUNITY): Payer: Self-pay

## 2024-03-23 ENCOUNTER — Other Ambulatory Visit (HOSPITAL_COMMUNITY): Payer: Self-pay

## 2024-04-17 ENCOUNTER — Other Ambulatory Visit (HOSPITAL_COMMUNITY): Payer: Self-pay

## 2024-05-15 ENCOUNTER — Other Ambulatory Visit (HOSPITAL_COMMUNITY): Payer: Self-pay

## 2024-05-18 ENCOUNTER — Other Ambulatory Visit (HOSPITAL_COMMUNITY): Payer: Self-pay
